# Patient Record
Sex: Female | Born: 1965 | Race: White | Hispanic: No | Marital: Single | Smoking: Current every day smoker
Health system: Southern US, Community
[De-identification: ages and names within clinical notes are randomized; demographics above are authoritative.]

---

## 2018-09-05 ENCOUNTER — Encounter: Payer: Self-pay | Admitting: Pediatric Intensive Care

## 2018-09-20 ENCOUNTER — Encounter: Payer: Self-pay | Admitting: Hematology

## 2018-09-20 NOTE — Congregational Nurse Program (Unsigned)
IRC Covid 19 Screening performed at [facility].  Patient AxO, afebrile, and denies any signs or symptoms of Covid at this time.  PHQ-9 performed and assessed need for assistance with obtaining medications.  Patient needs assistance obtaining her medications.  A referral will be made to [facility].

## 2018-09-20 NOTE — Progress Notes (Signed)
error 

## 2018-09-27 ENCOUNTER — Telehealth: Payer: Self-pay

## 2018-09-27 NOTE — Progress Notes (Signed)
COVID Hotel Screening performed. Temperature, PHQ-9, and need for medical care and medications assessed. Patient asked for assistance for ADHD medication. Patient has never been diagnosed, tested, or treated by a physician. No referral made at this time. Recommended that patient speak to her provider.  Carlyle Basques RN MSN

## 2018-09-30 NOTE — Telephone Encounter (Signed)
Spoke with Linda Rosales as a F/U from [facility] evaluation. Stated that she has an appointment with [facility] in [facility] for renewal of her medication of Ritalin. Gave her my phone # and will advise to keep me abreast of events.

## 2018-10-04 NOTE — Progress Notes (Signed)
COVID Hotel Screening performed. Temperature, PHQ-9, and need for medical care and medications assessed. No additional needs assessed at this time.  Kenai Fluegel RN MSN 

## 2018-10-11 ENCOUNTER — Telehealth: Payer: Self-pay

## 2018-10-11 NOTE — Progress Notes (Signed)
COVID Hotel Screening performed. Temperature, PHQ-9, and need for medical care and medications assessed. No additional needs assessed at this time.  Ann Bohne RN MSN 

## 2018-10-11 NOTE — Telephone Encounter (Signed)
Spoke with client who states that her medication refill was sent and picked up at [facility]. States that she has no complaints and has everything she needs.Gave her my phone # if circumstances should change.

## 2018-10-16 NOTE — Congregational Nurse Program (Signed)
  Dept: [phone]   Congregational Nurse Program Note  Date of Encounter: 09/05/2018  Past Medical History: No past medical history on file.  Encounter Details:Client has red itchy rash on left shoulder. CN advises clean with soap and water. Apply OTC cortisone if itchy. Shann Medal RN BSN CNP

## 2018-10-17 ENCOUNTER — Emergency Department (HOSPITAL_COMMUNITY): Payer: Medicaid Other

## 2018-10-17 ENCOUNTER — Encounter (HOSPITAL_COMMUNITY): Payer: Self-pay | Admitting: Emergency Medicine

## 2018-10-17 ENCOUNTER — Other Ambulatory Visit: Payer: Self-pay

## 2018-10-17 ENCOUNTER — Emergency Department (HOSPITAL_COMMUNITY)
Admission: EM | Admit: 2018-10-17 | Discharge: 2018-10-17 | Disposition: A | Discharge status: Home / Self Care | Payer: Medicaid Other | Attending: Emergency Medicine | Admitting: Emergency Medicine

## 2018-10-17 DIAGNOSIS — Y929 Unspecified place or not applicable: Secondary | ICD-10-CM | POA: Insufficient documentation

## 2018-10-17 DIAGNOSIS — Y999 Unspecified external cause status: Secondary | ICD-10-CM | POA: Insufficient documentation

## 2018-10-17 DIAGNOSIS — S80212A Abrasion, left knee, initial encounter: Secondary | ICD-10-CM | POA: Diagnosis not present

## 2018-10-17 DIAGNOSIS — S50312A Abrasion of left elbow, initial encounter: Secondary | ICD-10-CM | POA: Diagnosis not present

## 2018-10-17 DIAGNOSIS — R0789 Other chest pain: Secondary | ICD-10-CM | POA: Insufficient documentation

## 2018-10-17 DIAGNOSIS — Y939 Activity, unspecified: Secondary | ICD-10-CM | POA: Insufficient documentation

## 2018-10-17 DIAGNOSIS — F17210 Nicotine dependence, cigarettes, uncomplicated: Secondary | ICD-10-CM | POA: Insufficient documentation

## 2018-10-17 MED ORDER — IBUPROFEN 400 MG PO TABS
600.0000 mg | ORAL_TABLET | Freq: Once | ORAL | Status: AC
  Administered 2018-10-17: 600 mg via ORAL
  Filled 2018-10-17: qty 1

## 2018-10-17 MED ORDER — DICLOFENAC SODIUM 1 % TD GEL: 4.0000 g | Freq: Four times a day (QID) | TRANSDERMAL | 0 refills | Status: AC

## 2018-10-17 MED ORDER — OXYCODONE-ACETAMINOPHEN 5-325 MG PO TABS: 1.0000 | ORAL_TABLET | Freq: Four times a day (QID) | ORAL | 0 refills | Status: AC | PRN

## 2018-10-17 MED ORDER — LIDOCAINE 5 % EX PTCH: 1.0000 | MEDICATED_PATCH | CUTANEOUS | 0 refills | Status: DC
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
## 2018-10-17 NOTE — Discharge Instructions (Addendum)
Rib injuries  You have sustained a rib injury.  There were no acute abnormalities noted on the x-ray, including no sign of fractures, although there can be fractures that do not show up on the x-ray, called occult fractures.  Please adhere to the following instructions: Incentive spirometer: This device is used to ensure proper expansion of the lungs and to help prevent secondary issues, such as pneumonia.  Think of this as physical therapy for your lungs while you are injured.  Perform lung expansion exercises every 1-2 hours while awake.  Have an initial goal of 1000 mL and then work to increase this value.  Antiinflammatory medications: Take 600 mg of ibuprofen every 6 hours or 440 mg (over the counter dose) to 500 mg (prescription dose) of naproxen every 12 hours for the next 3 days. After this time, these medications may be used as needed for pain. Take these medications with food to avoid upset stomach. Choose only one of these medications, do not take them together. Tylenol: Should you continue to have additional pain while taking the ibuprofen or naproxen, you may add in tylenol as needed. Your daily total maximum amount of tylenol from all sources should be limited to 4000mg /day for persons without liver problems, or 2000mg /day for those with liver problems. Percocet: May take Percocet (oxycodone-acetaminophen) as needed for severe pain.   Do not drive or perform other dangerous activities while taking this medication as it can cause drowsiness as well as changes in reaction time and judgement.  Please note that each pill of Percocet contains 325 mg of acetaminophen (generic for Tylenol) and the above dosage limits apply. Diclofenac gel: This is a topical anti-inflammatory medication and can be applied directly to the painful region.  Do not use on the face or genitals.  This medication may be used as an alternative to oral anti-inflammatory medications, such as ibuprofen or naproxen.  Other  pain management: Many parts of pain management involve experimentation to find what works for you as an individual patient.  You may try lidocaine patches, topical pain relievers, or hot/cold packs.  You may also need to change your sleeping position.  This may involve sleeping propped up in a chair or with extra pillows.  Duration of pain: For bruising or contusions to the ribs, pain can last 4-6 weeks.  For rib fractures, you can expect to have discomfort for 6-12 weeks.  It should be noted that even if there are no obvious fractures on the x-rays, you may have what is called an occult fracture.  This simply means that the bone is broken, but is not broken enough to be noted on x-ray.  Follow-up: Please follow-up with a primary care provider for any further management of this issue.  Any further pain management should also be handled by a primary care provider.  Return: Return to the ED should you begin to have significantly worsening pain, onset of shortness of breath (not just hesitancy to take a deep breath due to pain), fever over 100.3 F accompanied by cough, coughing up blood, or any other major concerns.  For prescription assistance, may try using prescription discount sites or apps, such as goodrx.com   Wound Care - General Wound Cleaning: Clean the wound and surrounding area gently with tap water and mild soap. Rinse well and blot dry. Do not scrub the wound, as this may cause the wound edges to come apart. You may shower, but avoid submerging the wound, such as with a bath  or swimming.  Clean the wound daily to prevent infection.  Do not use cleaners such as hydrogen peroxide or alcohol.   Scar reduction: Application of a topical antibiotic ointment, such as Neosporin, after the wound has begun to close and heal well can decrease scab formation and reduce scarring. After the wound has healed, application of ointments such as Aquaphor can also reduce scar formation.  The key to scar  reduction is keeping the skin well hydrated and supple. Drinking plenty of water throughout the day (At least eight 8oz glasses of water a day) is essential to staying well hydrated.  Sun exposure: Keep the wound out of the sun. After the wound has healed, continue to protect it from the sun by wearing protective clothing or applying sunscreen.  Pain: You may use Tylenol, naproxen, or ibuprofen for pain. Antiinflammatory medications: Take 600 mg of ibuprofen every 6 hours or 440 mg (over the counter dose) to 500 mg (prescription dose) of naproxen every 12 hours for the next 3 days. After this time, these medications may be used as needed for pain. Take these medications with food to avoid upset stomach. Choose only one of these medications, do not take them together. Acetaminophen (generic for Tylenol): Should you continue to have additional pain while taking the ibuprofen or naproxen, you may add in acetaminophen as needed. Your daily total maximum amount of acetaminophen from all sources should be limited to 4000mg /day for persons without liver problems, or 2000mg /day for those with liver problems.  Return: Return to the ED should signs of infection arise, such as spreading redness, puffiness/swelling, pus draining from the wound, severe increase in pain, fever over 100.30F, or any other major issues.  For prescription assistance, may try using prescription discount sites or apps, such as goodrx.com
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
## 2018-10-18 NOTE — Progress Notes (Signed)
COVID Hotel Screening performed. Temperature, PHQ-9, and need for medical care and medications assessed. No additional needs assessed at this time.  Azahel Belcastro RN MSN 

## 2019-01-31 ENCOUNTER — Encounter (HOSPITAL_COMMUNITY): Payer: Self-pay | Admitting: Emergency Medicine

## 2019-01-31 ENCOUNTER — Emergency Department (HOSPITAL_COMMUNITY)
Admission: EM | Admit: 2019-01-31 | Discharge: 2019-01-31 | Disposition: A | Discharge status: Home / Self Care | Payer: Medicaid Other | Attending: Emergency Medicine | Admitting: Emergency Medicine

## 2019-01-31 ENCOUNTER — Emergency Department (HOSPITAL_COMMUNITY): Payer: Medicaid Other

## 2019-01-31 ENCOUNTER — Other Ambulatory Visit: Payer: Self-pay

## 2019-01-31 DIAGNOSIS — F17200 Nicotine dependence, unspecified, uncomplicated: Secondary | ICD-10-CM | POA: Insufficient documentation

## 2019-01-31 DIAGNOSIS — F1910 Other psychoactive substance abuse, uncomplicated: Secondary | ICD-10-CM | POA: Insufficient documentation

## 2019-01-31 DIAGNOSIS — R4182 Altered mental status, unspecified: Secondary | ICD-10-CM | POA: Diagnosis present

## 2019-01-31 LAB — CBC WITH DIFFERENTIAL/PLATELET
Abs Immature Granulocytes: 0.01 10*3/uL (ref 0.00–0.07)
Basophils Absolute: 0 10*3/uL (ref 0.0–0.1)
Basophils Relative: 0 %
Eosinophils Absolute: 0.2 10*3/uL (ref 0.0–0.5)
Eosinophils Relative: 4 %
HCT: 37.1 % (ref 36.0–46.0)
Hemoglobin: 12.1 g/dL (ref 12.0–15.0)
Immature Granulocytes: 0 %
Lymphocytes Relative: 23 %
Lymphs Abs: 1.3 10*3/uL (ref 0.7–4.0)
MCH: 30.4 pg (ref 26.0–34.0)
MCHC: 32.6 g/dL (ref 30.0–36.0)
MCV: 93.2 fL (ref 80.0–100.0)
Monocytes Absolute: 0.4 10*3/uL (ref 0.1–1.0)
Monocytes Relative: 7 %
Neutro Abs: 3.7 10*3/uL (ref 1.7–7.7)
Neutrophils Relative %: 66 %
Platelets: 204 10*3/uL (ref 150–400)
RBC: 3.98 MIL/uL (ref 3.87–5.11)
RDW: 12.3 % (ref 11.5–15.5)
WBC: 5.6 10*3/uL (ref 4.0–10.5)
nRBC: 0 % (ref 0.0–0.2)

## 2019-01-31 LAB — COMPREHENSIVE METABOLIC PANEL
ALT: 14 U/L (ref 0–44)
AST: 19 U/L (ref 15–41)
Albumin: 3.1 g/dL — ABNORMAL LOW (ref 3.5–5.0)
Alkaline Phosphatase: 75 U/L (ref 38–126)
Anion gap: 10 (ref 5–15)
BUN: 11 mg/dL (ref 6–20)
CO2: 26 mmol/L (ref 22–32)
Calcium: 8.9 mg/dL (ref 8.9–10.3)
Chloride: 109 mmol/L (ref 98–111)
Creatinine, Ser: 0.91 mg/dL (ref 0.44–1.00)
GFR calc Af Amer: >60 mL/min (ref >60)
GFR calc non Af Amer: >60 mL/min (ref >60)
Glucose, Bld: 114 mg/dL — ABNORMAL HIGH (ref 70–99)
Potassium: 3.6 mmol/L (ref 3.5–5.1)
Sodium: 145 mmol/L (ref 135–145)
Total Bilirubin: 0.3 mg/dL (ref 0.3–1.2)
Total Protein: 6.2 g/dL — ABNORMAL LOW (ref 6.5–8.1)

## 2019-01-31 LAB — URINALYSIS, ROUTINE W REFLEX MICROSCOPIC
Bacteria, UA: NONE SEEN
Bilirubin Urine: NEGATIVE
Glucose, UA: NEGATIVE mg/dL
Hgb urine dipstick: NEGATIVE
Ketones, ur: 5 mg/dL — AB
Nitrite: NEGATIVE
Protein, ur: NEGATIVE mg/dL
Specific Gravity, Urine: 1.023 (ref 1.005–1.030)
pH: 5 (ref 5.0–8.0)

## 2019-01-31 LAB — ETHANOL: Alcohol, Ethyl (B): <10 mg/dL (ref <10)

## 2019-01-31 LAB — RAPID URINE DRUG SCREEN, HOSP PERFORMED
Amphetamines: POSITIVE — AB
Barbiturates: NOT DETECTED
Benzodiazepines: POSITIVE — AB
Cocaine: POSITIVE — AB
Opiates: POSITIVE — AB
Tetrahydrocannabinol: NOT DETECTED

## 2019-01-31 LAB — HCG, QUANTITATIVE, PREGNANCY: hCG, Beta Chain, Quant, S: <1 m[IU]/mL (ref <5)

## 2019-01-31 NOTE — Discharge Instructions (Addendum)
DON'T USE DRUGS!!!

## 2019-01-31 NOTE — ED Notes (Signed)
Pt called for triage, no response from lobby 
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
## 2019-01-31 NOTE — ED Notes (Signed)
X-ray at bedside
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
## 2019-02-01 ENCOUNTER — Emergency Department (HOSPITAL_COMMUNITY)
Admission: EM | Admit: 2019-02-01 | Discharge: 2019-02-01 | Disposition: A | Discharge status: Home / Self Care | Payer: Medicaid Other | Attending: Emergency Medicine | Admitting: Emergency Medicine

## 2019-02-01 ENCOUNTER — Encounter (HOSPITAL_COMMUNITY): Payer: Self-pay | Admitting: Emergency Medicine

## 2019-02-01 ENCOUNTER — Emergency Department (HOSPITAL_COMMUNITY)
Admission: EM | Admit: 2019-02-01 | Discharge: 2019-02-01 | Discharge status: Against Medical Advice | Payer: Medicaid Other | Source: Home / Self Care

## 2019-02-01 ENCOUNTER — Other Ambulatory Visit: Payer: Self-pay

## 2019-02-01 DIAGNOSIS — Z041 Encounter for examination and observation following transport accident: Secondary | ICD-10-CM | POA: Diagnosis not present

## 2019-02-01 DIAGNOSIS — Z5321 Procedure and treatment not carried out due to patient leaving prior to being seen by health care provider: Secondary | ICD-10-CM | POA: Diagnosis not present
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
## 2019-02-01 NOTE — ED Notes (Signed)
Call patient several times patient didn't answer  

## 2019-02-02 ENCOUNTER — Encounter (HOSPITAL_COMMUNITY): Payer: Self-pay | Admitting: Emergency Medicine

## 2019-02-02 ENCOUNTER — Other Ambulatory Visit: Payer: Self-pay

## 2019-02-02 ENCOUNTER — Emergency Department (HOSPITAL_COMMUNITY)
Admission: EM | Admit: 2019-02-02 | Discharge: 2019-02-03 | Disposition: A | Discharge status: Home / Self Care | Payer: Medicaid Other | Attending: Emergency Medicine | Admitting: Emergency Medicine

## 2019-02-02 DIAGNOSIS — F17210 Nicotine dependence, cigarettes, uncomplicated: Secondary | ICD-10-CM | POA: Diagnosis not present

## 2019-02-02 DIAGNOSIS — R52 Pain, unspecified: Secondary | ICD-10-CM | POA: Diagnosis not present

## 2019-02-02 DIAGNOSIS — Z79899 Other long term (current) drug therapy: Secondary | ICD-10-CM | POA: Insufficient documentation
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
## 2019-02-03 MED ORDER — NAPROXEN 500 MG PO TABS: 500.0000 mg | ORAL_TABLET | Freq: Two times a day (BID) | ORAL | 0 refills | Status: DC

## 2019-02-03 MED ORDER — METHOCARBAMOL 500 MG PO TABS: 500.0000 mg | ORAL_TABLET | Freq: Two times a day (BID) | ORAL | 0 refills | Status: DC
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
## 2019-02-03 NOTE — ED Notes (Signed)
Attempting to discharge pt when she become boisterous and agitated. Stating that she needs to see a doctor. Attempted to explain to pt that she has been seen by our PA and that the medications she has been prescribed are the only ones that the PA will be giving her today. Pt behavior escalates to cursing and tossing her BP and pulse ox probe. Security called and pt being escorted from department. Pt ambulating of her own volition.
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
## 2019-02-05 ENCOUNTER — Other Ambulatory Visit: Payer: Self-pay

## 2019-02-05 ENCOUNTER — Emergency Department (HOSPITAL_COMMUNITY)
Admission: EM | Admit: 2019-02-05 | Discharge: 2019-02-05 | Disposition: A | Discharge status: Home / Self Care | Payer: Medicaid Other | Attending: Emergency Medicine | Admitting: Emergency Medicine

## 2019-02-05 DIAGNOSIS — R52 Pain, unspecified: Secondary | ICD-10-CM

## 2019-02-05 DIAGNOSIS — Z79899 Other long term (current) drug therapy: Secondary | ICD-10-CM | POA: Insufficient documentation

## 2019-02-05 DIAGNOSIS — M7918 Myalgia, other site: Secondary | ICD-10-CM | POA: Insufficient documentation

## 2019-02-05 DIAGNOSIS — F17210 Nicotine dependence, cigarettes, uncomplicated: Secondary | ICD-10-CM | POA: Insufficient documentation

## 2019-02-05 MED ORDER — NAPROXEN 500 MG PO TABS: 500.0000 mg | ORAL_TABLET | Freq: Two times a day (BID) | ORAL | 0 refills | Status: AC

## 2019-02-05 MED ORDER — KETOROLAC TROMETHAMINE 30 MG/ML IJ SOLN
15.0000 mg | Freq: Once | INTRAMUSCULAR | Status: AC
  Administered 2019-02-05: 15 mg via INTRAMUSCULAR
  Filled 2019-02-05: qty 1

## 2019-02-05 MED ORDER — METHOCARBAMOL 500 MG PO TABS: 500.0000 mg | ORAL_TABLET | Freq: Two times a day (BID) | ORAL | 0 refills | Status: AC

## 2019-02-05 MED ORDER — LIDOCAINE 5 % EX PTCH: 1.0000 | MEDICATED_PATCH | CUTANEOUS | 0 refills | Status: AC
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
## 2019-02-05 NOTE — Discharge Instructions (Signed)
Your medications were sent to the [facility] on [facility].  You can take naproxen twice daily with meals.  You can take Robaxin up to twice daily as needed for muscle spasm.  Apply lidocaine patch to areas of pain as prescribed.  Doing plenty of fluids get plenty of rest.  You can also take 1 to 2 tablets of Tylenol every 6 hours as needed for pain.  You can do some gentle stretching, apply ice or heat to areas of pain, whichever feels best, 20 minutes at a time 3-4 times daily.  Follow-up with the methadone clinic for reevaluation of your symptoms and management of your pain.  We do not manage chronic pain in the emergency department and we cannot prescribe methadone.  Return to the emergency department if any concerning signs or symptoms develop such as persistent vomiting, high fevers, or weakness of an extremity.
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
## 2019-02-06 ENCOUNTER — Telehealth: Payer: Self-pay | Admitting: *Deleted

## 2019-02-06 NOTE — Telephone Encounter (Signed)
TOC CM received call from pharmacy and wanted to verify RX. States they received and reviewed with pharmacy. [place], [place] ED TOC CM [phone]

## 2019-02-10 ENCOUNTER — Emergency Department (HOSPITAL_COMMUNITY)
Admission: EM | Admit: 2019-02-10 | Discharge: 2019-02-11 | Disposition: A | Discharge status: Home / Self Care | Payer: Medicaid Other | Attending: Emergency Medicine | Admitting: Emergency Medicine

## 2019-02-10 ENCOUNTER — Emergency Department (HOSPITAL_COMMUNITY): Payer: Medicaid Other

## 2019-02-10 ENCOUNTER — Encounter (HOSPITAL_COMMUNITY): Payer: Self-pay | Admitting: Emergency Medicine

## 2019-02-10 ENCOUNTER — Other Ambulatory Visit: Payer: Self-pay

## 2019-02-10 DIAGNOSIS — Z7189 Other specified counseling: Secondary | ICD-10-CM | POA: Diagnosis not present

## 2019-02-10 DIAGNOSIS — R4182 Altered mental status, unspecified: Secondary | ICD-10-CM | POA: Insufficient documentation

## 2019-02-10 DIAGNOSIS — Z590 Homelessness: Secondary | ICD-10-CM | POA: Insufficient documentation

## 2019-02-10 DIAGNOSIS — F17210 Nicotine dependence, cigarettes, uncomplicated: Secondary | ICD-10-CM | POA: Insufficient documentation

## 2019-02-10 DIAGNOSIS — S32591A Other specified fracture of right pubis, initial encounter for closed fracture: Secondary | ICD-10-CM

## 2019-02-10 DIAGNOSIS — M25552 Pain in left hip: Secondary | ICD-10-CM | POA: Insufficient documentation

## 2019-02-10 LAB — COMPREHENSIVE METABOLIC PANEL
ALT: 26 U/L (ref 0–44)
AST: 41 U/L (ref 15–41)
Albumin: 3.7 g/dL (ref 3.5–5.0)
Alkaline Phosphatase: 123 U/L (ref 38–126)
Anion gap: 10 (ref 5–15)
BUN: 22 mg/dL — ABNORMAL HIGH (ref 6–20)
CO2: 25 mmol/L (ref 22–32)
Calcium: 9.1 mg/dL (ref 8.9–10.3)
Chloride: 106 mmol/L (ref 98–111)
Creatinine, Ser: 0.94 mg/dL (ref 0.44–1.00)
GFR calc Af Amer: >60 mL/min (ref >60)
GFR calc non Af Amer: >60 mL/min (ref >60)
Glucose, Bld: 106 mg/dL — ABNORMAL HIGH (ref 70–99)
Potassium: 4.4 mmol/L (ref 3.5–5.1)
Sodium: 141 mmol/L (ref 135–145)
Total Bilirubin: 0.6 mg/dL (ref 0.3–1.2)
Total Protein: 7 g/dL (ref 6.5–8.1)

## 2019-02-10 LAB — CBC WITH DIFFERENTIAL/PLATELET
Abs Immature Granulocytes: 0.01 10*3/uL (ref 0.00–0.07)
Basophils Absolute: 0 10*3/uL (ref 0.0–0.1)
Basophils Relative: 0 %
Eosinophils Absolute: 0.3 10*3/uL (ref 0.0–0.5)
Eosinophils Relative: 4 %
HCT: 35.9 % — ABNORMAL LOW (ref 36.0–46.0)
Hemoglobin: 11.6 g/dL — ABNORMAL LOW (ref 12.0–15.0)
Immature Granulocytes: 0 %
Lymphocytes Relative: 14 %
Lymphs Abs: 1 10*3/uL (ref 0.7–4.0)
MCH: 29.7 pg (ref 26.0–34.0)
MCHC: 32.3 g/dL (ref 30.0–36.0)
MCV: 92.1 fL (ref 80.0–100.0)
Monocytes Absolute: 0.5 10*3/uL (ref 0.1–1.0)
Monocytes Relative: 8 %
Neutro Abs: 4.9 10*3/uL (ref 1.7–7.7)
Neutrophils Relative %: 74 %
Platelets: 309 10*3/uL (ref 150–400)
RBC: 3.9 MIL/uL (ref 3.87–5.11)
RDW: 12.3 % (ref 11.5–15.5)
WBC: 6.8 10*3/uL (ref 4.0–10.5)
nRBC: 0 % (ref 0.0–0.2)

## 2019-02-10 LAB — ETHANOL: Alcohol, Ethyl (B): <10 mg/dL (ref <10)

## 2019-02-10 MED ORDER — LACTATED RINGERS IV BOLUS
1000.0000 mL | Freq: Once | INTRAVENOUS | Status: AC
  Administered 2019-02-10: 1000 mL via INTRAVENOUS
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
## 2019-02-10 NOTE — ED Notes (Signed)
Patient found lying on floor instead of stretcher. Patient states that she did not fall, but chose to lay down for comfort. Patient states that pain "is not too bad right now." Patient assisted to stretcher, provided warm blanket, and instructed to remain in bed.
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
## 2019-02-10 NOTE — ED Notes (Signed)
Patient transported to radiology

## 2019-02-10 NOTE — ED Notes (Signed)
Patient made aware that urine sample is needed.  

## 2019-02-11 LAB — RAPID URINE DRUG SCREEN, HOSP PERFORMED
Amphetamines: POSITIVE — AB
Barbiturates: NOT DETECTED
Benzodiazepines: POSITIVE — AB
Cocaine: NOT DETECTED
Opiates: NOT DETECTED
Tetrahydrocannabinol: NOT DETECTED

## 2019-02-11 MED ORDER — KETOROLAC TROMETHAMINE 30 MG/ML IJ SOLN
30.0000 mg | Freq: Once | INTRAMUSCULAR | Status: AC
  Administered 2019-02-11: 09:00:00 30 mg via INTRAMUSCULAR
  Filled 2019-02-11: qty 1

## 2019-02-11 MED ORDER — IBUPROFEN 800 MG PO TABS: 800.0000 mg | ORAL_TABLET | Freq: Four times a day (QID) | ORAL | 0 refills | Status: AC | PRN
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
## 2019-02-11 NOTE — ED Notes (Signed)
Patient continuously calling out and stating that she is ready to go home. EDP made aware.

## 2019-02-11 NOTE — Progress Notes (Signed)
TOC CM contacted [facility], spoke to rep, [place] for RW with seat to be delivered to pt's room prior to dc. [place], [place] ED TOC CM [phone]
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
## 2019-02-12 ENCOUNTER — Ambulatory Visit (HOSPITAL_COMMUNITY)
Admission: RE | Admit: 2019-02-12 | Discharge: 2019-02-12 | Disposition: A | Discharge status: Home / Self Care | Payer: Medicaid Other | Attending: Psychiatry | Admitting: Psychiatry

## 2019-02-12 DIAGNOSIS — R45851 Suicidal ideations: Secondary | ICD-10-CM | POA: Diagnosis not present

## 2019-02-12 DIAGNOSIS — F17210 Nicotine dependence, cigarettes, uncomplicated: Secondary | ICD-10-CM | POA: Insufficient documentation

## 2019-02-12 DIAGNOSIS — F1120 Opioid dependence, uncomplicated: Secondary | ICD-10-CM | POA: Diagnosis not present
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
## 2019-03-09 ENCOUNTER — Encounter (HOSPITAL_COMMUNITY): Payer: Self-pay

## 2019-03-09 ENCOUNTER — Ambulatory Visit (HOSPITAL_COMMUNITY)
Admission: EM | Admit: 2019-03-09 | Discharge: 2019-03-09 | Disposition: A | Discharge status: Home / Self Care | Payer: Medicaid Other | Attending: Physician Assistant | Admitting: Physician Assistant

## 2019-03-09 ENCOUNTER — Other Ambulatory Visit: Payer: Self-pay

## 2019-03-09 DIAGNOSIS — L02419 Cutaneous abscess of limb, unspecified: Secondary | ICD-10-CM

## 2019-03-09 DIAGNOSIS — L03115 Cellulitis of right lower limb: Secondary | ICD-10-CM

## 2019-03-09 MED ORDER — LIDOCAINE HCL (PF) 2 % IJ SOLN
INTRAMUSCULAR | Status: AC
  Filled 2019-03-09: qty 2

## 2019-03-09 MED ORDER — CEFTRIAXONE SODIUM 1 G IJ SOLR
1.0000 g | Freq: Once | INTRAMUSCULAR | Status: AC
  Administered 2019-03-09: 12:00:00 1 g via INTRAMUSCULAR

## 2019-03-09 MED ORDER — SULFAMETHOXAZOLE-TRIMETHOPRIM 800-160 MG PO TABS: 1.0000 | ORAL_TABLET | Freq: Two times a day (BID) | ORAL | 0 refills | Status: DC

## 2019-03-09 MED ORDER — LIDOCAINE-EPINEPHRINE (PF) 2 %-1:200000 IJ SOLN
INTRAMUSCULAR | Status: AC
  Filled 2019-03-09: qty 20

## 2019-03-09 MED ORDER — CEFTRIAXONE SODIUM 1 G IJ SOLR
INTRAMUSCULAR | Status: AC
  Filled 2019-03-09: qty 10

## 2019-03-09 NOTE — Discharge Instructions (Addendum)
Return in 2 days for wound check and dressing change. Keep area clean and dry. Take medication as prescribed.

## 2019-03-09 NOTE — ED Triage Notes (Signed)
Pt presents with abscess to back of right leg X 1 week.

## 2019-03-09 NOTE — ED Provider Notes (Signed)
[facility]    CSN: [id number] Arrival date & time: 03/09/19  1051      History   Chief Complaint Chief Complaint  Patient presents with  . Abscess    Right Leg    HPI Linda Rosales is a 53 y.o. female.   Patient here concerned with pain, swelling R lower leg.  Started 1 week ago, worsening.  Notes today leg is swollen, tender, she's had an abscess before and feels that she has another one.  Denies fatigue, fever, chills, nausea, vomiting.  No advil or tylenol today.     History reviewed. No pertinent past medical history.  There are no active problems to display for this patient.   History reviewed. No pertinent surgical history.  OB History   No obstetric history on file.      Home Medications    Prior to Admission medications   Medication Sig Start Date End Date Taking? Authorizing Provider  diclofenac sodium (VOLTAREN) 1 % GEL Apply 4 g topically 4 (four) times daily. Patient not taking: Reported on 02/10/2019 10/17/18   [name], [name] C, PA-C  gabapentin (NEURONTIN) 400 MG capsule Take 400 mg by mouth 3 (three) times daily.    [provider]  ibuprofen (ADVIL) 800 MG tablet Take 1 tablet (800 mg total) by mouth every 6 (six) hours as needed for moderate pain. 02/11/19   [name], MD  lidocaine (LIDODERM) 5 % Place 1 patch onto the skin daily. Remove & Discard patch within 12 hours or as directed by MD Patient not taking: Reported on 02/10/2019 02/05/19   Rodell Perna A, PA-C  methocarbamol (ROBAXIN) 500 MG tablet Take 1 tablet (500 mg total) by mouth 2 (two) times daily. Patient not taking: Reported on 02/10/2019 02/05/19   Rodell Perna A, PA-C  naproxen (NAPROSYN) 500 MG tablet Take 1 tablet (500 mg total) by mouth 2 (two) times daily with a meal. Patient not taking: Reported on 02/10/2019 02/05/19   Rodell Perna A, PA-C  oxyCODONE-acetaminophen (PERCOCET/ROXICET) 5-325 MG tablet Take 1 tablet by mouth every 6 (six) hours as needed for  severe pain. Patient not taking: Reported on 02/10/2019 10/17/18   [name], [name] C, PA-C  sulfamethoxazole-trimethoprim (BACTRIM DS) 800-160 MG tablet Take 1 tablet by mouth 2 (two) times daily. 03/09/19   [name], PA-C    Family History Family History  Family history unknown: Yes    Social History Social History   Tobacco Use  . Smoking status: Current Every Day Smoker    Packs/day: 0.25  . Smokeless tobacco: Never Used  Substance Use Topics  . Alcohol use: Not Currently  . Drug use: Never     Allergies   Patient has no known allergies.   Review of Systems Review of Systems  Constitutional: Negative for activity change, appetite change, chills, fatigue and fever.  Gastrointestinal: Negative for abdominal pain, diarrhea, nausea and vomiting.  Musculoskeletal: Positive for myalgias. Negative for arthralgias.  Skin: Positive for rash.  Allergic/Immunologic: Negative for immunocompromised state.  Neurological: Negative for weakness, light-headedness and numbness.  Hematological: Negative for adenopathy. Does not bruise/bleed easily.  Psychiatric/Behavioral: Negative for confusion and sleep disturbance.     Physical Exam Triage Vital Signs ED Triage Vitals  Enc Vitals Group     BP 03/09/19 1107 121/76     Pulse Rate 03/09/19 1107 83     Resp 03/09/19 1107 17     Temp 03/09/19 1107 98.6 F (37 C)  Temp Source 03/09/19 1107 Oral     SpO2 03/09/19 1107 95 %     Weight --      Height --      Head Circumference --      Peak Flow --      Pain Score 03/09/19 1108 9     Pain Loc --      Pain Edu? --      Excl. in GC? --    No data found.  Updated Vital Signs BP 121/76 (BP Location: Left Arm)   Pulse 83   Temp 98.6 F (37 C) (Oral)   Resp 17   SpO2 95%   Visual Acuity Right Eye Distance:   Left Eye Distance:   Bilateral Distance:    Right Eye Near:   Left Eye Near:    Bilateral Near:     Physical Exam Vitals signs and nursing note reviewed.   Constitutional:      General: She is not in acute distress.    Appearance: She is well-developed. She is not ill-appearing or toxic-appearing.  HENT:     Head: Normocephalic and atraumatic.     Nose: Nose normal.     Mouth/Throat:     Mouth: Mucous membranes are moist.  Eyes:     Extraocular Movements: Extraocular movements intact.     Conjunctiva/sclera: Conjunctivae normal.     Pupils: Pupils are equal, round, and reactive to light.  Neck:     Musculoskeletal: Normal range of motion and neck supple.  Cardiovascular:     Rate and Rhythm: Normal rate and regular rhythm.     Heart sounds: No murmur.  Pulmonary:     Effort: Pulmonary effort is normal. No respiratory distress.     Breath sounds: Normal breath sounds.  Abdominal:     Palpations: Abdomen is soft.     Tenderness: There is no abdominal tenderness.  Musculoskeletal:     Right knee: She exhibits normal range of motion.     Right lower leg: She exhibits tenderness and swelling. She exhibits no laceration. No edema.       Legs:  Skin:    General: Skin is warm and dry.     Capillary Refill: Capillary refill takes less than 2 seconds.  Neurological:     General: No focal deficit present.     Mental Status: She is alert and oriented to person, place, and time.  Psychiatric:        Mood and Affect: Mood normal.        Behavior: Behavior normal.      UC Treatments / Results  Labs (all labs ordered are listed, but only abnormal results are displayed) Labs Reviewed - No data to display  EKG   Radiology No results found.  Procedures Incision and Drainage  Date/Time: 03/09/2019 11:38 AM Performed by: Evern CoreLindquist, Jady Braggs, PA-C Authorized by: Evern CoreLindquist, Jesscia Imm, PA-C   Consent:    Consent obtained:  Verbal   Consent given by:  Patient   Risks discussed:  Bleeding, incomplete drainage, pain and damage to other organs   Alternatives discussed:  No treatment and delayed treatment Universal protocol:    Procedure  explained and questions answered to patient or proxy's satisfaction: yes     Relevant documents present and verified: yes     Test results available and properly labeled: yes     Imaging studies available: yes     Required blood products, implants, devices, and special equipment available: yes  Site/side marked: yes     Immediately prior to procedure a time out was called: yes     Patient identity confirmed:  Verbally with patient Location:    Type:  Abscess   Size:  4 cm   Location:  Lower extremity   Lower extremity location:  Leg   Leg location:  R lower leg Pre-procedure details:    Skin preparation:  Betadine Anesthesia (see MAR for exact dosages):    Anesthesia method:  Local infiltration   Local anesthetic:  Lidocaine 2% WITH epi Procedure type:    Complexity:  Complex Procedure details:    Needle aspiration: no     Incision types:  Single straight   Incision depth:  Subcutaneous   Scalpel blade:  11   Wound management:  Probed and deloculated, irrigated with saline and extensive cleaning   Drainage:  Purulent   Drainage amount:  Copious   Wound treatment:  Wound left open   Packing materials:  1/2 in iodoform gauze Post-procedure details:    Patient tolerance of procedure:  Tolerated well, no immediate complications   (including critical care time)  Medications Ordered in UC Medications  cefTRIAXone (ROCEPHIN) injection 1 g (1 g Intramuscular Given 03/09/19 1156)  lidocaine-EPINEPHrine (XYLOCAINE W/EPI) 2 %-1:200000 (PF) injection (has no administration in time range)  cefTRIAXone (ROCEPHIN) 1 g injection (has no administration in time range)  lidocaine (XYLOCAINE) 2 % injection (has no administration in time range)    Initial Impression / Assessment and Plan / UC Course  I have reviewed the triage vital signs and the nursing notes.  Pertinent labs & imaging results that were available during my care of the patient were reviewed by me and considered in my  medical decision making (see chart for details).      Final Clinical Impressions(s) / UC Diagnoses   Final diagnoses:  Cellulitis and abscess of leg     Discharge Instructions     Return in 2 days for wound check and dressing change. Keep area clean and dry. Take medication as prescribed.     ED Prescriptions    Medication Sig Dispense Auth. Provider   sulfamethoxazole-trimethoprim (BACTRIM DS) 800-160 MG tablet Take 1 tablet by mouth 2 (two) times daily. 20 tablet Evern Core, PA-C     PDMP not reviewed this encounter.   Evern Core, PA-C 03/09/19 1225

## 2019-03-12 ENCOUNTER — Encounter (HOSPITAL_COMMUNITY): Payer: Self-pay

## 2019-03-12 ENCOUNTER — Ambulatory Visit (HOSPITAL_COMMUNITY)
Admission: EM | Admit: 2019-03-12 | Discharge: 2019-03-12 | Disposition: A | Discharge status: Home / Self Care | Payer: Medicaid Other | Attending: Family Medicine | Admitting: Family Medicine

## 2019-03-12 ENCOUNTER — Other Ambulatory Visit: Payer: Self-pay

## 2019-03-12 DIAGNOSIS — L03115 Cellulitis of right lower limb: Secondary | ICD-10-CM

## 2019-03-12 DIAGNOSIS — Z09 Encounter for follow-up examination after completed treatment for conditions other than malignant neoplasm: Secondary | ICD-10-CM

## 2019-03-12 MED ORDER — CEFTRIAXONE SODIUM 1 G IJ SOLR
1.0000 g | Freq: Once | INTRAMUSCULAR | Status: AC
  Administered 2019-03-12: 1 g via INTRAMUSCULAR

## 2019-03-12 MED ORDER — CEFTRIAXONE SODIUM 1 G IJ SOLR
INTRAMUSCULAR | Status: AC
  Filled 2019-03-12: qty 10

## 2019-03-12 NOTE — ED Triage Notes (Signed)
Pt presents for wound check to large abscess that is on the back of her right leg.

## 2019-03-12 NOTE — ED Provider Notes (Signed)
[place]    CSN: [id number] Arrival date & time: 03/12/19  1014      History   Chief Complaint Chief Complaint  Patient presents with  . Follow-up  . Wound Check    HPI Linda Rosales is a 53 y.o. female.   Linda Rosales presents for follow up s/p I&D of right calf on 10/9 with cellulitis. She was given rocephin as well as course of bactrim at that time. Still with drainage. She feels the pressure has decreased some but it is still quite tender. No fevers. Has been taking antibiotics as prescribed. States she has had similar years ago. Per chart review history of recent polysubstance abuse.     ROS per HPI, negative if not otherwise mentioned.      History reviewed. No pertinent past medical history.  There are no active problems to display for this patient.   History reviewed. No pertinent surgical history.  OB History   No obstetric history on file.      Home Medications    Prior to Admission medications   Medication Sig Start Date End Date Taking? Authorizing Provider  diclofenac sodium (VOLTAREN) 1 % GEL Apply 4 g topically 4 (four) times daily. Patient not taking: Reported on 02/10/2019 10/17/18   [name], [name] C, PA-C  gabapentin (NEURONTIN) 400 MG capsule Take 400 mg by mouth 3 (three) times daily.    [provider]  ibuprofen (ADVIL) 800 MG tablet Take 1 tablet (800 mg total) by mouth every 6 (six) hours as needed for moderate pain. 02/11/19   [name], MD  lidocaine (LIDODERM) 5 % Place 1 patch onto the skin daily. Remove & Discard patch within 12 hours or as directed by MD Patient not taking: Reported on 02/10/2019 02/05/19   Rodell Perna A, PA-C  methocarbamol (ROBAXIN) 500 MG tablet Take 1 tablet (500 mg total) by mouth 2 (two) times daily. Patient not taking: Reported on 02/10/2019 02/05/19   Rodell Perna A, PA-C  naproxen (NAPROSYN) 500 MG tablet Take 1 tablet (500 mg total) by mouth 2 (two) times daily with a meal.  Patient not taking: Reported on 02/10/2019 02/05/19   Rodell Perna A, PA-C  oxyCODONE-acetaminophen (PERCOCET/ROXICET) 5-325 MG tablet Take 1 tablet by mouth every 6 (six) hours as needed for severe pain. Patient not taking: Reported on 02/10/2019 10/17/18   [name], [name] C, PA-C  sulfamethoxazole-trimethoprim (BACTRIM DS) 800-160 MG tablet Take 1 tablet by mouth 2 (two) times daily. 03/09/19   [name], PA-C    Family History Family History  Family history unknown: Yes    Social History Social History   Tobacco Use  . Smoking status: Current Every Day Smoker    Packs/day: 0.25  . Smokeless tobacco: Never Used  Substance Use Topics  . Alcohol use: Not Currently  . Drug use: Never     Allergies   Patient has no known allergies.   Review of Systems Review of Systems   Physical Exam Triage Vital Signs ED Triage Vitals  Enc Vitals Group     BP 03/12/19 1107 108/73     Pulse Rate 03/12/19 1107 62     Resp 03/12/19 1107 17     Temp 03/12/19 1107 97.6 F (36.4 C)     Temp Source 03/12/19 1107 Oral     SpO2 03/12/19 1107 95 %     Weight --      Height --      Head Circumference --  Peak Flow --      Pain Score 03/12/19 1109 6     Pain Loc --      Pain Edu? --      Excl. in GC? --    No data found.  Updated Vital Signs BP 108/73 (BP Location: Right Arm)   Pulse 62   Temp 97.6 F (36.4 C) (Oral)   Resp 17   SpO2 95%   Visual Acuity Right Eye Distance:   Left Eye Distance:   Bilateral Distance:    Right Eye Near:   Left Eye Near:    Bilateral Near:     Physical Exam Constitutional:      General: She is not in acute distress.    Appearance: She is well-developed.  Cardiovascular:     Rate and Rhythm: Normal rate.  Pulmonary:     Effort: Pulmonary effort is normal.  Skin:    General: Skin is warm and dry.     Comments: Right posterior calf still with large red with induration, tender; still active purulent drainage with packing, and large amount  with packing removal; at least approximately 8-12 inches of packing removed with approximately 4-6 inches replaced as patient unable to tolerated; quite deep pocket   Neurological:     Mental Status: She is alert and oriented to person, place, and time.        UC Treatments / Results  Labs (all labs ordered are listed, but only abnormal results are displayed) Labs Reviewed - No data to display  EKG   Radiology No results found.  Procedures Procedures (including critical care time)  Medications Ordered in UC Medications  cefTRIAXone (ROCEPHIN) injection 1 g (1 g Intramuscular Given 03/12/19 1154)  cefTRIAXone (ROCEPHIN) 1 g injection (has no administration in time range)    Initial Impression / Assessment and Plan / UC Course  I have reviewed the triage vital signs and the nursing notes.  Pertinent labs & imaging results that were available during my care of the patient were reviewed by me and considered in my medical decision making (see chart for details).     Quite impressive in depth and affected area size of abscess and cellulitis to posterior calf. Repacked due to significance of size of pocket and drainage quantity, patient unable to tolerate this very well, however. Encouraged follow up with wound care for long term management. Return here in two days if unable to get to wound care. Additional rocephin provided today as well.  Final Clinical Impressions(s) / UC Diagnoses   Final diagnoses:  Encounter for recheck of abscess following incision and drainage  Cellulitis of right lower extremity     Discharge Instructions     Continue with antibiotics as previously prescribed.  Please follow up with our Wound Care clinic, call today to try to get in in the next few days.  If unable to get in soon, may return here for recheck in two days.  Please go to the ER if develop any worsening of pain, redness, or fevers.    ED Prescriptions    None     PDMP not  reviewed this encounter.   Georgetta Haber, NP 03/12/19 1310

## 2019-03-12 NOTE — Discharge Instructions (Signed)
Continue with antibiotics as previously prescribed.  Please follow up with our Wound Care clinic, call today to try to get in in the next few days.  If unable to get in soon, may return here for recheck in two days.  Please go to the ER if develop any worsening of pain, redness, or fevers.

## 2019-03-21 ENCOUNTER — Other Ambulatory Visit: Payer: Self-pay

## 2019-03-21 ENCOUNTER — Emergency Department (HOSPITAL_COMMUNITY)
Admission: EM | Admit: 2019-03-21 | Discharge: 2019-03-21 | Disposition: A | Discharge status: Home / Self Care | Payer: Medicaid Other | Attending: Emergency Medicine | Admitting: Emergency Medicine

## 2019-03-21 ENCOUNTER — Encounter (HOSPITAL_COMMUNITY): Payer: Self-pay | Admitting: *Deleted

## 2019-03-21 DIAGNOSIS — Z590 Homelessness: Secondary | ICD-10-CM | POA: Insufficient documentation

## 2019-03-21 DIAGNOSIS — R404 Transient alteration of awareness: Secondary | ICD-10-CM | POA: Diagnosis present

## 2019-03-21 DIAGNOSIS — F17210 Nicotine dependence, cigarettes, uncomplicated: Secondary | ICD-10-CM | POA: Diagnosis not present

## 2019-03-21 DIAGNOSIS — Z79899 Other long term (current) drug therapy: Secondary | ICD-10-CM | POA: Insufficient documentation

## 2019-03-21 MED ORDER — ACETAMINOPHEN 325 MG PO TABS
650.0000 mg | ORAL_TABLET | Freq: Once | ORAL | Status: DC
  Filled 2019-03-21: qty 2

## 2019-03-21 NOTE — Discharge Instructions (Addendum)
Make sure that you are eating and drinking regularly.  Use Tylenol or Motrin for pain.
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
## 2019-03-21 NOTE — ED Notes (Signed)
Pt tolerating eating and drinking well

## 2019-03-21 NOTE — ED Triage Notes (Signed)
Pt sleeping and does not open her eys when name is called. Pt responds to light sternal rub. Unable access Iv site because of poor veins.

## 2019-03-21 NOTE — ED Notes (Signed)
Pt states tylenol is not strong enough for her pain. RN notified MD. MD aware.

## 2019-03-21 NOTE — ED Notes (Signed)
RN made two attempts to stick for IV and blood. RN was unsuccessful. IV team order is in.

## 2019-03-21 NOTE — ED Notes (Signed)
Discharge instructions discussed with pt. Pt verbalized understanding. Pt stable and ambulatory. No signature pad available. 
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
## 2019-03-28 ENCOUNTER — Other Ambulatory Visit: Payer: Self-pay

## 2019-03-28 ENCOUNTER — Ambulatory Visit (HOSPITAL_COMMUNITY)
Admission: EM | Admit: 2019-03-28 | Discharge: 2019-03-28 | Disposition: A | Discharge status: Home / Self Care | Payer: Medicaid Other | Attending: Family Medicine | Admitting: Family Medicine

## 2019-03-28 ENCOUNTER — Encounter (HOSPITAL_COMMUNITY): Payer: Self-pay

## 2019-03-28 DIAGNOSIS — L03115 Cellulitis of right lower limb: Secondary | ICD-10-CM

## 2019-03-28 DIAGNOSIS — F1190 Opioid use, unspecified, uncomplicated: Secondary | ICD-10-CM

## 2019-03-28 DIAGNOSIS — Z09 Encounter for follow-up examination after completed treatment for conditions other than malignant neoplasm: Secondary | ICD-10-CM

## 2019-03-28 DIAGNOSIS — F1120 Opioid dependence, uncomplicated: Secondary | ICD-10-CM

## 2019-03-28 MED ORDER — GABAPENTIN 400 MG PO CAPS: 800.0000 mg | ORAL_CAPSULE | Freq: Three times a day (TID) | ORAL | 0 refills | Status: AC

## 2019-03-28 MED ORDER — SULFAMETHOXAZOLE-TRIMETHOPRIM 800-160 MG PO TABS: 1.0000 | ORAL_TABLET | Freq: Two times a day (BID) | ORAL | 0 refills | Status: AC

## 2019-03-28 NOTE — Discharge Instructions (Addendum)
Continue with warm compresses to the affected area.  5 additional days of antibiotics.  Please return for any worsening of pain redness or swelling, or fevers.  EKG is normal today

## 2019-03-28 NOTE — ED Triage Notes (Signed)
Pt here for wound follow-up on her right lower leg, states she had a abscess drained and wants to make sure it is healing okay. Pt also wants an EKG, states her methadone clinic needs one on file so they can change her methadone dosage.

## 2019-03-28 NOTE — ED Provider Notes (Signed)
[place]    CSN: [id number] Arrival date & time: 03/28/19  1572      History   Chief Complaint Chief Complaint  Patient presents with  . Follow-up    HPI Linda Rosales is a 53 y.o. female.   Linda Rosales presents for follow up s/p I&D of right posterior calf. She completed a course of bactrim, and had received two IM doses of Rocephin. Significant improvement. Now with only mild redness. Minimal drainage. No fevers. Also requesting EKG as she is starting? Methadone and needs a current ekg first. History  Of polysubstance abuse. No chest pain , no palpitations.    ROS per HPI, negative if not otherwise mentioned.      History reviewed. No pertinent past medical history.  There are no active problems to display for this patient.   History reviewed. No pertinent surgical history.  OB History   No obstetric history on file.      Home Medications    Prior to Admission medications   Medication Sig Start Date End Date Taking? Authorizing Provider  diclofenac sodium (VOLTAREN) 1 % GEL Apply 4 g topically 4 (four) times daily. Patient not taking: Reported on 02/10/2019 10/17/18   [name], [name] C, PA-C  gabapentin (NEURONTIN) 400 MG capsule Take 2 capsules (800 mg total) by mouth 3 (three) times daily for 2 days. 03/28/19 03/30/19  Zigmund Gottron, NP  ibuprofen (ADVIL) 800 MG tablet Take 1 tablet (800 mg total) by mouth every 6 (six) hours as needed for moderate pain. 02/11/19   [name], MD  lidocaine (LIDODERM) 5 % Place 1 patch onto the skin daily. Remove & Discard patch within 12 hours or as directed by MD Patient not taking: Reported on 02/10/2019 02/05/19   Rodell Perna A, PA-C  methocarbamol (ROBAXIN) 500 MG tablet Take 1 tablet (500 mg total) by mouth 2 (two) times daily. Patient not taking: Reported on 02/10/2019 02/05/19   Rodell Perna A, PA-C  naproxen (NAPROSYN) 500 MG tablet Take 1 tablet (500 mg total) by mouth 2 (two) times daily  with a meal. Patient not taking: Reported on 02/10/2019 02/05/19   Rodell Perna A, PA-C  oxyCODONE-acetaminophen (PERCOCET/ROXICET) 5-325 MG tablet Take 1 tablet by mouth every 6 (six) hours as needed for severe pain. Patient not taking: Reported on 02/10/2019 10/17/18   [name], [name] C, PA-C  sulfamethoxazole-trimethoprim (BACTRIM DS) 800-160 MG tablet Take 1 tablet by mouth 2 (two) times daily for 5 days. 03/28/19 04/02/19  Zigmund Gottron, NP    Family History Family History  Problem Relation Age of Onset  . Cancer Mother   . Cancer Father   . Cancer Brother     Social History Social History   Tobacco Use  . Smoking status: Current Every Day Smoker    Packs/day: 0.25    Types: Cigarettes  . Smokeless tobacco: Never Used  Substance Use Topics  . Alcohol use: Not Currently  . Drug use: Not Currently    Comment: former     Allergies   Patient has no known allergies.   Review of Systems Review of Systems   Physical Exam Triage Vital Signs ED Triage Vitals  Enc Vitals Group     BP 03/28/19 1004 99/65     Pulse Rate 03/28/19 1004 80     Resp 03/28/19 1004 16     Temp 03/28/19 1004 97.9 F (36.6 C)     Temp Source 03/28/19 1004 Oral  SpO2 03/28/19 1004 97 %     Weight --      Height --      Head Circumference --      Peak Flow --      Pain Score 03/28/19 1002 0     Pain Loc --      Pain Edu? --      Excl. in GC? --    No data found.  Updated Vital Signs BP 99/65 (BP Location: Left Arm)   Pulse 80   Temp 97.9 F (36.6 C) (Oral)   Resp 16   SpO2 97%   Visual Acuity Right Eye Distance:   Left Eye Distance:   Bilateral Distance:    Right Eye Near:   Left Eye Near:    Bilateral Near:     Physical Exam Constitutional:      General: She is not in acute distress.    Appearance: She is well-developed.  Cardiovascular:     Rate and Rhythm: Normal rate.  Pulmonary:     Effort: Pulmonary effort is normal.  Skin:    General: Skin is warm and dry.      Comments: Right posterior calf with significant improvement, incision well healing and now with some redness with mild tenderness just proximal to incision  Neurological:     Mental Status: She is alert and oriented to person, place, and time.    EKG:  NSR. Previous EKG was available for review. No stwave changes as interpreted by me.    UC Treatments / Results  Labs (all labs ordered are listed, but only abnormal results are displayed) Labs Reviewed - No data to display  EKG   Radiology No results found.  Procedures Procedures (including critical care time)  Medications Ordered in UC Medications - No data to display  Initial Impression / Assessment and Plan / UC Course  I have reviewed the triage vital signs and the nursing notes.  Pertinent labs & imaging results that were available during my care of the patient were reviewed by me and considered in my medical decision making (see chart for details).     Significant improvement to calf wound. Will provide an additional 5 days of antibiotics as still with mild redness and tenderness to soft tissue, although no further fluctuance and significant overall improvement. ekg obtained and normal here today. Refilled gabapentin for two days as she is awaiting [facility] ref[facility]. Return precautions provided. Patient verbalized understanding and agreeable to plan.    Final Clinical Impressions(s) / UC Diagnoses   Final diagnoses:  Encounter for recheck of abscess following incision and drainage  Cellulitis of right lower extremity  Methadone use (HCC)     Discharge Instructions     Continue with warm compresses to the affected area.  5 additional days of antibiotics.  Please return for any worsening of pain redness or swelling, or fevers.  EKG is normal today    ED Prescriptions    Medication Sig Dispense Auth. Provider   sulfamethoxazole-trimethoprim (BACTRIM DS) 800-160 MG tablet Take 1 tablet by mouth 2 (two) times daily  for 5 days. 10 tablet Linus Mako B, NP   gabapentin (NEURONTIN) 400 MG capsule Take 2 capsules (800 mg total) by mouth 3 (three) times daily for 2 days. 12 capsule Georgetta Haber, NP     PDMP not reviewed this encounter.   Georgetta Haber, NP 03/28/19 1037

## 2019-04-28 ENCOUNTER — Emergency Department (HOSPITAL_COMMUNITY): Payer: Medicaid Other

## 2019-04-28 ENCOUNTER — Emergency Department (HOSPITAL_COMMUNITY)
Admission: EM | Admit: 2019-04-28 | Discharge: 2019-04-28 | Disposition: A | Discharge status: Home / Self Care | Payer: Medicaid Other | Source: Home / Self Care | Attending: Emergency Medicine | Admitting: Emergency Medicine

## 2019-04-28 ENCOUNTER — Other Ambulatory Visit: Payer: Self-pay

## 2019-04-28 ENCOUNTER — Encounter (HOSPITAL_COMMUNITY): Payer: Self-pay

## 2019-04-28 ENCOUNTER — Emergency Department (HOSPITAL_COMMUNITY)
Admission: EM | Admit: 2019-04-28 | Discharge: 2019-04-29 | Disposition: A | Discharge status: Home / Self Care | Payer: Medicaid Other | Attending: Emergency Medicine | Admitting: Emergency Medicine

## 2019-04-28 DIAGNOSIS — R4182 Altered mental status, unspecified: Secondary | ICD-10-CM | POA: Insufficient documentation

## 2019-04-28 DIAGNOSIS — Z79899 Other long term (current) drug therapy: Secondary | ICD-10-CM | POA: Insufficient documentation

## 2019-04-28 DIAGNOSIS — Z23 Encounter for immunization: Secondary | ICD-10-CM | POA: Insufficient documentation

## 2019-04-28 DIAGNOSIS — S0081XA Abrasion of other part of head, initial encounter: Secondary | ICD-10-CM | POA: Insufficient documentation

## 2019-04-28 DIAGNOSIS — Y999 Unspecified external cause status: Secondary | ICD-10-CM | POA: Insufficient documentation

## 2019-04-28 DIAGNOSIS — Y939 Activity, unspecified: Secondary | ICD-10-CM | POA: Insufficient documentation

## 2019-04-28 DIAGNOSIS — F17210 Nicotine dependence, cigarettes, uncomplicated: Secondary | ICD-10-CM | POA: Insufficient documentation

## 2019-04-28 DIAGNOSIS — S70311A Abrasion, right thigh, initial encounter: Secondary | ICD-10-CM | POA: Insufficient documentation

## 2019-04-28 DIAGNOSIS — T07XXXA Unspecified multiple injuries, initial encounter: Secondary | ICD-10-CM

## 2019-04-28 DIAGNOSIS — S0231XA Fracture of orbital floor, right side, initial encounter for closed fracture: Secondary | ICD-10-CM | POA: Insufficient documentation

## 2019-04-28 DIAGNOSIS — F1110 Opioid abuse, uncomplicated: Secondary | ICD-10-CM | POA: Insufficient documentation

## 2019-04-28 DIAGNOSIS — Z20828 Contact with and (suspected) exposure to other viral communicable diseases: Secondary | ICD-10-CM | POA: Diagnosis not present

## 2019-04-28 DIAGNOSIS — Y92410 Unspecified street and highway as the place of occurrence of the external cause: Secondary | ICD-10-CM | POA: Insufficient documentation

## 2019-04-28 DIAGNOSIS — T50901A Poisoning by unspecified drugs, medicaments and biological substances, accidental (unintentional), initial encounter: Secondary | ICD-10-CM | POA: Diagnosis not present

## 2019-04-28 DIAGNOSIS — F410 Panic disorder [episodic paroxysmal anxiety] without agoraphobia: Secondary | ICD-10-CM | POA: Diagnosis present

## 2019-04-28 LAB — I-STAT CHEM 8, ED
BUN: 21 mg/dL — ABNORMAL HIGH (ref 6–20)
Calcium, Ion: 1.18 mmol/L (ref 1.15–1.40)
Chloride: 101 mmol/L (ref 98–111)
Creatinine, Ser: 0.9 mg/dL (ref 0.44–1.00)
Glucose, Bld: 81 mg/dL (ref 70–99)
HCT: 39 % (ref 36.0–46.0)
Hemoglobin: 13.3 g/dL (ref 12.0–15.0)
Potassium: 4.1 mmol/L (ref 3.5–5.1)
Sodium: 137 mmol/L (ref 135–145)
TCO2: 30 mmol/L (ref 22–32)

## 2019-04-28 LAB — URINALYSIS, COMPLETE (UACMP) WITH MICROSCOPIC
Bacteria, UA: NONE SEEN
Bilirubin Urine: NEGATIVE
Glucose, UA: NEGATIVE mg/dL
Hgb urine dipstick: NEGATIVE
Ketones, ur: NEGATIVE mg/dL
Leukocytes,Ua: NEGATIVE
Nitrite: NEGATIVE
Protein, ur: NEGATIVE mg/dL
Specific Gravity, Urine: 1.033 — ABNORMAL HIGH (ref 1.005–1.030)
pH: 7 (ref 5.0–8.0)

## 2019-04-28 LAB — CBC WITH DIFFERENTIAL/PLATELET
Abs Immature Granulocytes: 0.01 10*3/uL (ref 0.00–0.07)
Basophils Absolute: 0 10*3/uL (ref 0.0–0.1)
Basophils Relative: 1 %
Eosinophils Absolute: 0.2 10*3/uL (ref 0.0–0.5)
Eosinophils Relative: 5 %
HCT: 37.7 % (ref 36.0–46.0)
Hemoglobin: 12.5 g/dL (ref 12.0–15.0)
Immature Granulocytes: 0 %
Lymphocytes Relative: 28 %
Lymphs Abs: 1.2 10*3/uL (ref 0.7–4.0)
MCH: 30.1 pg (ref 26.0–34.0)
MCHC: 33.2 g/dL (ref 30.0–36.0)
MCV: 90.8 fL (ref 80.0–100.0)
Monocytes Absolute: 0.7 10*3/uL (ref 0.1–1.0)
Monocytes Relative: 16 %
Neutro Abs: 2.3 10*3/uL (ref 1.7–7.7)
Neutrophils Relative %: 50 %
Platelets: 176 10*3/uL (ref 150–400)
RBC: 4.15 MIL/uL (ref 3.87–5.11)
RDW: 14.3 % (ref 11.5–15.5)
WBC: 4.5 10*3/uL (ref 4.0–10.5)
nRBC: 0 % (ref 0.0–0.2)

## 2019-04-28 LAB — SAMPLE TO BLOOD BANK

## 2019-04-28 LAB — COMPREHENSIVE METABOLIC PANEL
ALT: 112 U/L — ABNORMAL HIGH (ref 0–44)
ALT: 105 U/L — ABNORMAL HIGH (ref 0–44)
AST: 112 U/L — ABNORMAL HIGH (ref 15–41)
AST: 103 U/L — ABNORMAL HIGH (ref 15–41)
Albumin: 3.9 g/dL (ref 3.5–5.0)
Albumin: 3.6 g/dL (ref 3.5–5.0)
Alkaline Phosphatase: 123 U/L (ref 38–126)
Alkaline Phosphatase: 112 U/L (ref 38–126)
Anion gap: 10 (ref 5–15)
Anion gap: 12 (ref 5–15)
BUN: 17 mg/dL (ref 6–20)
BUN: 11 mg/dL (ref 6–20)
CO2: 26 mmol/L (ref 22–32)
CO2: 25 mmol/L (ref 22–32)
Calcium: 9.5 mg/dL (ref 8.9–10.3)
Calcium: 9 mg/dL (ref 8.9–10.3)
Chloride: 101 mmol/L (ref 98–111)
Chloride: 103 mmol/L (ref 98–111)
Creatinine, Ser: 0.88 mg/dL (ref 0.44–1.00)
Creatinine, Ser: 0.77 mg/dL (ref 0.44–1.00)
GFR calc Af Amer: >60 mL/min (ref >60)
GFR calc Af Amer: >60 mL/min (ref >60)
GFR calc non Af Amer: >60 mL/min (ref >60)
GFR calc non Af Amer: >60 mL/min (ref >60)
Glucose, Bld: 82 mg/dL (ref 70–99)
Glucose, Bld: 83 mg/dL (ref 70–99)
Potassium: 4.3 mmol/L (ref 3.5–5.1)
Potassium: 3.4 mmol/L — ABNORMAL LOW (ref 3.5–5.1)
Sodium: 137 mmol/L (ref 135–145)
Sodium: 140 mmol/L (ref 135–145)
Total Bilirubin: 0.3 mg/dL (ref 0.3–1.2)
Total Bilirubin: 0.7 mg/dL (ref 0.3–1.2)
Total Protein: 7 g/dL (ref 6.5–8.1)
Total Protein: 6.8 g/dL (ref 6.5–8.1)

## 2019-04-28 LAB — RAPID URINE DRUG SCREEN, HOSP PERFORMED
Amphetamines: POSITIVE — AB
Barbiturates: NOT DETECTED
Benzodiazepines: POSITIVE — AB
Cocaine: NOT DETECTED
Opiates: POSITIVE — AB
Tetrahydrocannabinol: NOT DETECTED

## 2019-04-28 LAB — PROTIME-INR
INR: 0.9 (ref 0.8–1.2)
Prothrombin Time: 12 seconds (ref 11.4–15.2)

## 2019-04-28 LAB — URINALYSIS, ROUTINE W REFLEX MICROSCOPIC
Bilirubin Urine: NEGATIVE
Glucose, UA: NEGATIVE mg/dL
Hgb urine dipstick: NEGATIVE
Ketones, ur: NEGATIVE mg/dL
Leukocytes,Ua: NEGATIVE
Nitrite: NEGATIVE
Protein, ur: NEGATIVE mg/dL
Specific Gravity, Urine: 1.006 (ref 1.005–1.030)
pH: 7 (ref 5.0–8.0)

## 2019-04-28 LAB — CBC
HCT: 38.9 % (ref 36.0–46.0)
Hemoglobin: 12.5 g/dL (ref 12.0–15.0)
MCH: 29.4 pg (ref 26.0–34.0)
MCHC: 32.1 g/dL (ref 30.0–36.0)
MCV: 91.5 fL (ref 80.0–100.0)
Platelets: 163 10*3/uL (ref 150–400)
RBC: 4.25 MIL/uL (ref 3.87–5.11)
RDW: 14.1 % (ref 11.5–15.5)
WBC: 3.4 10*3/uL — ABNORMAL LOW (ref 4.0–10.5)
nRBC: 0 % (ref 0.0–0.2)

## 2019-04-28 LAB — I-STAT BETA HCG BLOOD, ED (MC, WL, AP ONLY)
I-stat hCG, quantitative: <5 m[IU]/mL (ref <5)
I-stat hCG, quantitative: <5 m[IU]/mL (ref <5)

## 2019-04-28 LAB — ETHANOL
Alcohol, Ethyl (B): 12 mg/dL — ABNORMAL HIGH (ref <10)
Alcohol, Ethyl (B): <10 mg/dL (ref <10)

## 2019-04-28 LAB — CBG MONITORING, ED: Glucose-Capillary: 92 mg/dL (ref 70–99)

## 2019-04-28 LAB — LACTIC ACID, PLASMA: Lactic Acid, Venous: 1.6 mmol/L (ref 0.5–1.9)

## 2019-04-28 LAB — CDS SEROLOGY

## 2019-04-28 MED ORDER — LORAZEPAM 2 MG/ML IJ SOLN: 2.0000 mg | Freq: Once | INTRAMUSCULAR | Status: DC

## 2019-04-28 MED ORDER — IOHEXOL 300 MG/ML  SOLN
100.0000 mL | Freq: Once | INTRAMUSCULAR | Status: AC | PRN
  Administered 2019-04-28: 100 mL via INTRAVENOUS

## 2019-04-28 MED ORDER — SODIUM CHLORIDE 0.9 % IV BOLUS
1000.0000 mL | Freq: Once | INTRAVENOUS | Status: AC
  Administered 2019-04-28: 1000 mL via INTRAVENOUS

## 2019-04-28 MED ORDER — LORAZEPAM 2 MG/ML IJ SOLN: 2.0000 mg | INTRAMUSCULAR | Status: DC

## 2019-04-28 MED ORDER — BACITRACIN ZINC 500 UNIT/GM EX OINT
TOPICAL_OINTMENT | Freq: Two times a day (BID) | CUTANEOUS | Status: AC
  Administered 2019-04-28: 14:00:00 via TOPICAL

## 2019-04-28 MED ORDER — LORAZEPAM 2 MG/ML IJ SOLN
2.0000 mg | Freq: Once | INTRAMUSCULAR | Status: AC
  Administered 2019-04-28: 2 mg via INTRAVENOUS
  Filled 2019-04-28: qty 1

## 2019-04-28 MED ORDER — HALOPERIDOL LACTATE 5 MG/ML IJ SOLN
INTRAMUSCULAR | Status: AC
  Administered 2019-04-28: 5 mg
  Filled 2019-04-28: qty 1

## 2019-04-28 MED ORDER — TETANUS-DIPHTH-ACELL PERTUSSIS 5-2.5-18.5 LF-MCG/0.5 IM SUSP
0.5000 mL | Freq: Once | INTRAMUSCULAR | Status: AC
  Administered 2019-04-28: 10:00:00 0.5 mL via INTRAMUSCULAR

## 2019-04-28 MED ORDER — LORAZEPAM 2 MG/ML IJ SOLN: 1.0000 mg | Freq: Once | INTRAMUSCULAR | Status: DC

## 2019-04-28 MED ORDER — HALOPERIDOL LACTATE 5 MG/ML IJ SOLN: 5.0000 mg | Freq: Once | INTRAMUSCULAR | Status: DC

## 2019-04-28 MED ORDER — AFRIN NASAL SPRAY 0.05 % NA SOLN: 1.0000 | Freq: Two times a day (BID) | NASAL | 0 refills | Status: AC

## 2019-04-28 NOTE — Progress Notes (Signed)
Orthopedic Tech Progress Note Patient Details:  Linda Rosales [birth year] [id number]  Ortho Devices Ortho Device/Splint Location: Level 2 Trauma Ortho Device/Splint Interventions: Application   Post Interventions Patient Tolerated: Well Instructions Provided: Care of device   Linda Rosales 04/28/2019, 9:50 AM
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
## 2019-04-28 NOTE — ED Notes (Addendum)
Patient combative and does not follow commands. Current O2 saturation at 99% on RA.
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
## 2019-04-28 NOTE — ED Notes (Signed)
Ambulated patient with no assistance, PT walked around room with no complaints. PT placed back in bed and hooked up to monitor
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
## 2019-04-28 NOTE — ED Triage Notes (Signed)
Pt back to the ED after being seen earlier today , pt is back asking for meds for "panic attacks"

## 2019-04-28 NOTE — ED Notes (Signed)
Patient transported to CT 
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
## 2019-04-29 ENCOUNTER — Other Ambulatory Visit: Payer: Self-pay

## 2019-04-29 LAB — SARS CORONAVIRUS 2 (TAT 6-24 HRS): SARS Coronavirus 2: NEGATIVE

## 2019-04-29 MED ORDER — SODIUM CHLORIDE 0.9 % IV BOLUS
1000.0000 mL | Freq: Once | INTRAVENOUS | Status: AC
  Administered 2019-04-29: 1000 mL via INTRAVENOUS

## 2019-04-29 NOTE — ED Notes (Signed)
Pt able to ambulate on the hallway and to the bathroom by her self. PO challenge started, pt able to drink fluids with no sign of nausea or vomiting.
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
## 2019-04-30 ENCOUNTER — Encounter (HOSPITAL_COMMUNITY): Payer: Self-pay

## 2020-01-08 ENCOUNTER — Ambulatory Visit (HOSPITAL_COMMUNITY)
Admission: EM | Admit: 2020-01-08 | Discharge: 2020-01-08 | Disposition: A | Discharge status: Home / Self Care | Payer: Medicaid Other | Attending: Behavioral Health | Admitting: Behavioral Health

## 2020-01-08 ENCOUNTER — Other Ambulatory Visit: Payer: Self-pay

## 2020-01-08 ENCOUNTER — Encounter (HOSPITAL_COMMUNITY): Payer: Self-pay | Admitting: Emergency Medicine

## 2020-01-08 DIAGNOSIS — F17210 Nicotine dependence, cigarettes, uncomplicated: Secondary | ICD-10-CM | POA: Insufficient documentation

## 2020-01-08 DIAGNOSIS — F22 Delusional disorders: Secondary | ICD-10-CM | POA: Insufficient documentation

## 2020-01-08 DIAGNOSIS — Z20822 Contact with and (suspected) exposure to covid-19: Secondary | ICD-10-CM | POA: Insufficient documentation

## 2020-01-08 DIAGNOSIS — R45851 Suicidal ideations: Secondary | ICD-10-CM | POA: Insufficient documentation

## 2020-01-08 DIAGNOSIS — F1994 Other psychoactive substance use, unspecified with psychoactive substance-induced mood disorder: Secondary | ICD-10-CM | POA: Insufficient documentation

## 2020-01-08 LAB — POC SARS CORONAVIRUS 2 AG: SARS Coronavirus 2 Ag: NEGATIVE

## 2020-01-08 LAB — POCT URINE DRUG SCREEN - MANUAL ENTRY (I-SCREEN)
POC Amphetamine UR: NOT DETECTED
POC Buprenorphine (BUP): POSITIVE — AB
POC Cocaine UR: POSITIVE — AB
POC Marijuana UR: NOT DETECTED
POC Methadone UR: NOT DETECTED
POC Methamphetamine UR: POSITIVE — AB
POC Morphine: POSITIVE — AB
POC Oxazepam (BZO): NOT DETECTED
POC Oxycodone UR: NOT DETECTED
POC Secobarbital (BAR): NOT DETECTED

## 2020-01-08 LAB — SARS CORONAVIRUS 2 BY RT PCR (HOSPITAL ORDER, PERFORMED IN ~~LOC~~ HOSPITAL LAB): SARS Coronavirus 2: NEGATIVE

## 2020-01-08 LAB — POCT PREGNANCY, URINE: Preg Test, Ur: NEGATIVE

## 2020-01-08 LAB — POC SARS CORONAVIRUS 2 AG -  ED: SARS Coronavirus 2 Ag: NEGATIVE

## 2020-01-08 MED ORDER — MAGNESIUM HYDROXIDE 400 MG/5ML PO SUSP: 30.0000 mL | Freq: Every day | ORAL | Status: DC | PRN

## 2020-01-08 MED ORDER — ACETAMINOPHEN 325 MG PO TABS: 650.0000 mg | ORAL_TABLET | Freq: Four times a day (QID) | ORAL | Status: DC | PRN

## 2020-01-08 MED ORDER — TRAZODONE HCL 50 MG PO TABS: 50.0000 mg | ORAL_TABLET | Freq: Every evening | ORAL | Status: DC | PRN

## 2020-01-08 MED ORDER — HYDROXYZINE HCL 25 MG PO TABS: 25.0000 mg | ORAL_TABLET | Freq: Three times a day (TID) | ORAL | Status: DC | PRN

## 2020-01-08 MED ORDER — ALUM & MAG HYDROXIDE-SIMETH 200-200-20 MG/5ML PO SUSP: 30.0000 mL | ORAL | Status: DC | PRN

## 2020-01-08 NOTE — ED Triage Notes (Addendum)
Pt presents with paranoia, feeling someone is following her.  Denies SI, HI.  Pt admits to drug use 3-4 days ago.

## 2020-01-08 NOTE — ED Provider Notes (Signed)
FBC/OBS ASAP Discharge Summary  Date and Time: 01/08/2020 7:54 AM  Name: Linda Rosales  MRN:  [id number]   Discharge Diagnoses:  Final diagnoses:  None    Subjective: Patient reports today that she is feeling better.  She states that she needs to leave so she can go get her methadone at her methadone clinic at [facility].  She states that she has been on methadone for several years.  Patient denies any suicidal homicidal ideations and denies any hallucinations.  Patient was offered various resources and patient stated that she would like to follow-up outpatient appointment if possible.  Patient was informed that she needed to contact the mark and resource was provided to her.  Stay Summary: Patient is a 54 year old female who presented to the [facility] UC via law enforcement related to voices and was following her.  Patient has a history of polysubstance abuse and was tested positive for buprenorphine, cocaine, morphine, and methamphetamines.  Patient reported that she receives methadone through [facility] patient reports that she last use illicit substances 4 to 5 days ago.  Patient denies any suicidal homicidal ideations today and states that she needs to leave so she could get her methadone today.  Upon chart review patient was at [facility] on August 7 and that when he first met his [facility] on progress fifth and for things very similar presentations.  Patient was discharged from his MDs the next morning.  Based on chart review patient is homeless.  Patient was provided resources for day mark.  Patient was discharged home per patient's request.  Patient did not meet inpatient psychiatric treatment does not meet any criteria for involuntary commitment.  Total Time spent with patient: 30 minutes  Past Psychiatric History: Polysubstance abuse, anxiety, frequent visits to various EDs reporting very similar symptoms Past Medical History: History reviewed. No pertinent past medical  history. History reviewed. No pertinent surgical history. Family History:  Family History  Problem Relation Age of Onset  . Cancer Mother   . Cancer Father   . Cancer Brother    Family Psychiatric History: None reported Social History:  Social History   Substance and Sexual Activity  Alcohol Use Yes   Comment: occ     Social History   Substance and Sexual Activity  Drug Use Yes  . Types: IV   Comment: former    Social History   Socioeconomic History  . Marital status: Single    Spouse name: Not on file  . Number of children: Not on file  . Years of education: Not on file  . Highest education level: Not on file  Occupational History  . Not on file  Tobacco Use  . Smoking status: Current Every Day Smoker    Packs/day: 0.25    Types: Cigarettes  . Smokeless tobacco: Never Used  Vaping Use  . Vaping Use: Never used  Substance and Sexual Activity  . Alcohol use: Yes    Comment: occ  . Drug use: Yes    Types: IV    Comment: former  . Sexual activity: Not on file  Other Topics Concern  . Not on file  Social History Narrative   ** Merged History Encounter **       Social Determinants of Health   Financial Resource Strain:   . Difficulty of Paying Living Expenses:   Food Insecurity:   . Worried About Charity fundraiser in the Last Year:   . [place] in the  Last Year:   Transportation Needs:   . Film/video editor (Medical):   Marland Kitchen Lack of Transportation (Non-Medical):   Physical Activity:   . Days of Exercise per Week:   . Minutes of Exercise per Session:   Stress:   . Feeling of Stress :   Social Connections:   . Frequency of Communication with Friends and Family:   . Frequency of Social Gatherings with Friends and Family:   . Attends Religious Services:   . Active Member of Clubs or Organizations:   . Attends Archivist Meetings:   Marland Kitchen Marital Status:    SDOH:  SDOH Screenings   Alcohol Screen:   . Last Alcohol Screening Score  (AUDIT):   Depression (PHQ2-9):   . PHQ-2 Score:   Financial Resource Strain:   . Difficulty of Paying Living Expenses:   Food Insecurity:   . Worried About Charity fundraiser in the Last Year:   . [place] in the Last Year:   Housing:   . Last Housing Risk Score:   Physical Activity:   . Days of Exercise per Week:   . Minutes of Exercise per Session:   Social Connections:   . Frequency of Communication with Friends and Family:   . Frequency of Social Gatherings with Friends and Family:   . Attends Religious Services:   . Active Member of Clubs or Organizations:   . Attends Archivist Meetings:   Marland Kitchen Marital Status:   Stress:   . Feeling of Stress :   Tobacco Use: High Risk  . Smoking Tobacco Use: Current Every Day Smoker  . Smokeless Tobacco Use: Never Used  Transportation Needs:   . Film/video editor (Medical):   Marland Kitchen Lack of Transportation (Non-Medical):     Has this patient used any form of tobacco in the last 30 days? (Cigarettes, Smokeless Tobacco, Cigars, and/or Pipes) A prescription for an FDA-approved tobacco cessation medication was offered at discharge and the patient refused  Current Medications:  Current Facility-Administered Medications  Medication Dose Route Frequency Provider Last Rate Last Admin  . acetaminophen (TYLENOL) tablet 650 mg  650 mg Oral Q6H PRN Caroline Sauger, NP      . alum & mag hydroxide-simeth (MAALOX/MYLANTA) 200-200-20 MG/5ML suspension 30 mL  30 mL Oral Q4H PRN Caroline Sauger, NP      . hydrOXYzine (ATARAX/VISTARIL) tablet 25 mg  25 mg Oral TID PRN Caroline Sauger, NP      . magnesium hydroxide (MILK OF MAGNESIA) suspension 30 mL  30 mL Oral Daily PRN Caroline Sauger, NP      . traZODone (DESYREL) tablet 50 mg  50 mg Oral QHS PRN Caroline Sauger, NP       Current Outpatient Medications  Medication Sig Dispense Refill  . aspirin-acetaminophen-caffeine (EXCEDRIN MIGRAINE) 250-250-65 MG tablet  Take 1-2 tablets by mouth every 6 (six) hours as needed for headache or migraine.    . diclofenac sodium (VOLTAREN) 1 % GEL Apply 4 g topically 4 (four) times daily. (Patient not taking: Reported on 02/10/2019) 100 g 0  . gabapentin (NEURONTIN) 400 MG capsule Take 2 capsules (800 mg total) by mouth 3 (three) times daily for 2 days. 12 capsule 0  . gabapentin (NEURONTIN) 800 MG tablet Take 800 mg by mouth 3 (three) times daily.    Marland Kitchen ibuprofen (ADVIL) 200 MG tablet Take 200-400 mg by mouth every 6 (six) hours as needed for headache or mild pain.    Marland Kitchen  ibuprofen (ADVIL) 800 MG tablet Take 1 tablet (800 mg total) by mouth every 6 (six) hours as needed for moderate pain. 20 tablet 0  . lidocaine (LIDODERM) 5 % Place 1 patch onto the skin daily. Remove & Discard patch within 12 hours or as directed by MD (Patient not taking: Reported on 02/10/2019) 30 patch 0  . methadone (DOLOPHINE) 10 MG/5ML solution Take 105 mg by mouth daily.    . methocarbamol (ROBAXIN) 500 MG tablet Take 1 tablet (500 mg total) by mouth 2 (two) times daily. (Patient not taking: Reported on 02/10/2019) 20 tablet 0  . mirtazapine (REMERON) 30 MG tablet Take 30 mg by mouth at bedtime.    . naproxen (NAPROSYN) 500 MG tablet Take 1 tablet (500 mg total) by mouth 2 (two) times daily with a meal. (Patient not taking: Reported on 02/10/2019) 30 tablet 0  . NON FORMULARY Place 1-2 tablets under the tongue See admin instructions. Pink Get Up & Go B12 plus L-Carnitine sublingual tablets: Dissolve 1-2 tablets sublingually once a day    . oxyCODONE-acetaminophen (PERCOCET/ROXICET) 5-325 MG tablet Take 1 tablet by mouth every 6 (six) hours as needed for severe pain. (Patient not taking: Reported on 02/10/2019) 6 tablet 0    PTA Medications: (Not in a hospital admission)   Musculoskeletal  Strength & Muscle Tone: within normal limits Gait & Station: normal Patient leans: N/A  Psychiatric Specialty Exam  Presentation  General Appearance:  Disheveled;Appropriate for Environment  Eye Contact:Good  Speech:Clear and Coherent;Normal Rate  Speech Volume:Decreased  Handedness:Right   Mood and Affect  Mood:Anxious  Affect:Congruent   Thought Process  Thought Processes:Coherent  Descriptions of Associations:Intact  Orientation:Full (Time, Place and Person)  Thought Content:WDL  Hallucinations:Hallucinations: None  Ideas of Reference:None  Suicidal Thoughts:Suicidal Thoughts: No SI Passive Intent and/or Plan: Without Intent  Homicidal Thoughts:Homicidal Thoughts: No   Sensorium  Memory:Immediate Good;Recent Good;Remote Good  Judgment:Fair  Insight:Fair   Executive Functions  Concentration:Good  Attention Span:Good  [place]  Language:Good   Psychomotor Activity  Psychomotor Activity:Psychomotor Activity: Increased   Assets  Assets:Communication Skills;Desire for Improvement;Financial Resources/Insurance   Sleep  Sleep:Sleep: Fair   Physical Exam  Physical Exam Vitals and nursing note reviewed.  Constitutional:      Appearance: She is well-developed.  Cardiovascular:     Rate and Rhythm: Normal rate.  Pulmonary:     Effort: Pulmonary effort is normal.  Musculoskeletal:        General: Normal range of motion.  Skin:    General: Skin is warm.  Neurological:     Mental Status: She is alert and oriented to person, place, and time.    Review of Systems  Constitutional: Negative.   HENT: Negative.   Eyes: Negative.   Respiratory: Negative.   Cardiovascular: Negative.   Gastrointestinal: Negative.   Genitourinary: Negative.   Musculoskeletal: Negative.   Skin: Negative.   Neurological: Negative.   Endo/Heme/Allergies: Negative.   Psychiatric/Behavioral: Positive for substance abuse. The patient is nervous/anxious.    Blood pressure 133/75, pulse 88, temperature 98.5 F (36.9 C), temperature source Oral, resp. rate 18, SpO2 99 %. There is no  height or weight on file to calculate BMI.  Demographic Factors:  Caucasian and Low socioeconomic status  Loss Factors: NA  Historical Factors: Impulsivity  Risk Reduction Factors:   Positive social support  Continued Clinical Symptoms:  Alcohol/Substance Abuse/Dependencies  Cognitive Features That Contribute To Risk:  None    Suicide Risk:  Mild:  Suicidal ideation of limited frequency, intensity, duration, and specificity.  There are no identifiable plans, no associated intent, mild dysphoria and related symptoms, good self-control (both objective and subjective assessment), few other risk factors, and identifiable protective factors, including available and accessible social support.  Plan Of Care/Follow-up recommendations:  Continue activity as tolerated. Continue diet as recommended by your PCP. Ensure to keep all appointments with outpatient providers.  Disposition: Discharge home. Requested resources for follow up outpatient appointment  Lewis Shock, FNP 01/08/2020, 7:54 AM

## 2020-01-08 NOTE — Discharge Instructions (Addendum)
Take medications as prescribed Call and make an appointment with [facility]

## 2020-01-08 NOTE — ED Notes (Signed)
Received Linda Rosales this AM at the nurses station, she is anxious to leave related to going to the methadone clinic. She received her AVS, her questions were answered.She received her personal belonging and was escorted to the lobby with her bus tokens.

## 2020-01-08 NOTE — ED Notes (Addendum)
Pt A&O x 4, presents with paranoia, feeling someone is following her.  Denies SI or HI.  Pt admits to drug use 3-4 days ago.  Skin search completed, monitoring for safety.  Pt is calm & cooperative, no distress noted.

## 2020-01-08 NOTE — ED Notes (Signed)
RN attempted, blood draw x 2, unsuccessful.

## 2020-01-08 NOTE — ED Provider Notes (Signed)
Behavioral Health Admission H&P  [facility] & OBS)  Date: 01/08/20 Patient Name: Linda Rosales MRN: [id number] Chief Complaint:  Chief Complaint  Patient presents with  . Paranoid   Chief Complaint/Presenting Problem: Paranoia and SI with plan to hang self.  Diagnoses: Polysubstance Abuse, Acute  Final diagnoses:  None   HPI: Linda Rosales presents to [facility] by law enforcement voluntarily due to her voicing that someone is following her. The patient stated that this person was following her three days ago and was also asked to leave the place where she was residing. The patient voiced, "I used to use drugs in the past." She was then asked when she last used substances? "I used four to five days ago." The patient has a history of poly-substance abuse. On today's visit, the patient is positive for Buprenorphine, Cocaine, Methamphetamine, and Morphine. The patient voiced she has suicidal Ideation because she is scared of the person who is following her. She expressed she is anxious and experiencing some depressive symptoms. The patient states she has seen psychiatric providers at [facility] in the past. She says she was proscribed medications from there and last took them three weeks ago. The patient voiced that she does not have any support system.   PHQ 2-9:    Documentation from 10/11/2018 in [facility]IENT [facility] Documentation from 10/04/2018 in [facility]IENT [facility] Documentation from 09/27/2018 in PATIENT [facility]  Thoughts that you would be better off dead, or of hurting yourself in some way Not at all Not at all Not at all  PHQ-9 Total Score 0 0 4        Total Time spent with patient: 30 minutes  Musculoskeletal  Strength & Muscle Tone: within normal limits Gait & Station: normal Patient leans: N/A  Psychiatric Specialty Exam  Presentation General Appearance: Appropriate for Environment;Disheveled  Eye Contact:Good  Speech:Clear  and Coherent;Normal Rate  Speech Volume:Normal  Handedness:Right   Mood and Affect  Mood:Anxious  Affect:Flat   Thought Process  Thought Processes:Coherent  Descriptions of Associations:Intact  Orientation:Full (Time, Place and Person)  Thought Content:WDL  Hallucinations:No data recorded Ideas of Reference:Paranoia  Suicidal Thoughts:Suicidal Thoughts: Yes, Passive SI Passive Intent and/or Plan: Without Intent  Homicidal Thoughts:Homicidal Thoughts: No   Sensorium  Memory:Immediate Good;Recent Good;Remote Good  Judgment:Fair  Insight:Lacking   Executive Functions  Concentration:Good  Attention Span:Good  Recall:Good  Fund of Knowledge:Good  Language:Good   Psychomotor Activity  Psychomotor Activity:Psychomotor Activity: Normal   Assets  Assets:Social Support;Financial Resources/Insurance;Housing;Desire for Improvement   Sleep  Sleep:Sleep: Poor   Physical Exam ROS  Blood pressure 133/75, pulse 88, temperature 98.5 F (36.9 C), temperature source Oral, resp. rate 18, SpO2 99 %. There is no height or weight on file to calculate BMI.  Past Psychiatric History: Polysubstance abuse, Anxiety  Is the patient at risk to self? No  Has the patient been a risk to self in the past 6 months? No .    Has the patient been a risk to self within the distant past? No   Is the patient a risk to others? No   Has the patient been a risk to others in the past 6 months? No   Has the patient been a risk to others within the distant past? No   Past Medical History: History reviewed. No  pertinent past medical history. History reviewed. No pertinent surgical history.  Family History:  Family History  Problem Relation Age of Onset  . Cancer Mother   . Cancer Father   . Cancer Brother     Social History:  Social History   Socioeconomic History  . Marital status: Single    Spouse name: Not on file  . Number of children: Not on file  . Years of  education: Not on file  . Highest education level: Not on file  Occupational History  . Not on file  Tobacco Use  . Smoking status: Current Every Day Smoker    Packs/day: 0.25    Types: Cigarettes  . Smokeless tobacco: Never Used  Vaping Use  . Vaping Use: Never used  Substance and Sexual Activity  . Alcohol use: Yes    Comment: occ  . Drug use: Yes    Types: IV    Comment: former  . Sexual activity: Not on file  Other Topics Concern  . Not on file  Social History Narrative   ** Merged History Encounter **       Social Determinants of Health   Financial Resource Strain:   . Difficulty of Paying Living Expenses:   Food Insecurity:   . Worried About Programme researcher, broadcasting/film/video in the Last Year:   . Barista in the Last Year:   Transportation Needs:   . Freight forwarder (Medical):   Marland Kitchen Lack of Transportation (Non-Medical):   Physical Activity:   . Days of Exercise per Week:   . Minutes of Exercise per Session:   Stress:   . Feeling of Stress :   Social Connections:   . Frequency of Communication with Friends and Family:   . Frequency of Social Gatherings with Friends and Family:   . Attends Religious Services:   . Active Member of Clubs or Organizations:   . Attends Banker Meetings:   Marland Kitchen Marital Status:   Intimate Partner Violence:   . Fear of Current or Ex-Partner:   . Emotionally Abused:   Marland Kitchen Physically Abused:   . Sexually Abused:     SDOH:  SDOH Screenings   Alcohol Screen:   . Last Alcohol Screening Score (AUDIT):   Depression (PHQ2-9):   . PHQ-2 Score:   Financial Resource Strain:   . Difficulty of Paying Living Expenses:   Food Insecurity:   . Worried About Programme researcher, broadcasting/film/video in the Last Year:   . [employer] of Food in the Last Year:   Housing:   . Last Housing Risk Score:   Physical Activity:   . Days of Exercise per Week:   . Minutes of Exercise per Session:   Social Connections:   . Frequency of Communication with Friends and  Family:   . Frequency of Social Gatherings with Friends and Family:   . Attends Religious Services:   . Active Member of Clubs or Organizations:   . Attends Banker Meetings:   Marland Kitchen Marital Status:   Stress:   . Feeling of Stress :   Tobacco Use: High Risk  . Smoking Tobacco Use: Current Every Day Smoker  . Smokeless Tobacco Use: Never Used  Transportation Needs:   . Freight forwarder (Medical):   Marland Kitchen Lack of Transportation (Non-Medical):     Last Labs:  Admission on 01/08/2020  Component Date Value Ref Range Status  . POC Amphetamine UR 01/08/2020 None Detected  None Detected Preliminary  .  POC Secobarbital (BAR) 01/08/2020 None Detected  None Detected Preliminary  . POC Buprenorphine (BUP) 01/08/2020 Positive* None Detected Preliminary  . POC Oxazepam (BZO) 01/08/2020 None Detected  None Detected Preliminary  . POC Cocaine UR 01/08/2020 Positive* None Detected Preliminary  . POC Methamphetamine UR 01/08/2020 Positive* None Detected Preliminary  . POC Morphine 01/08/2020 Positive* None Detected Preliminary  . POC Oxycodone UR 01/08/2020 None Detected  None Detected Preliminary  . POC Methadone UR 01/08/2020 None Detected  None Detected Preliminary  . POC Marijuana UR 01/08/2020 None Detected  None Detected Preliminary  . SARS Coronavirus 2 Ag 01/08/2020 Negative  Negative Preliminary  . SARS Coronavirus 2 Ag 01/08/2020 NEGATIVE  NEGATIVE Final   Comment: (NOTE) SARS-CoV-2 antigen NOT DETECTED.   Negative results are presumptive.  Negative results do not preclude SARS-CoV-2 infection and should not be used as the sole basis for treatment or other patient management decisions, including infection  control decisions, particularly in the presence of clinical signs and  symptoms consistent with COVID-19, or in those who have been in contact with the virus.  Negative results must be combined with clinical observations, patient history, and epidemiological information.  The expected result is Negative.  Fact Sheet for Patients: https://sanders-williams.net/  Fact Sheet for Healthcare Providers: https://martinez.com/   This test is not yet approved or cleared by the [place] FDA and  has been authorized for detection and/or diagnosis of SARS-CoV-2 by FDA under an Emergency Use Authorization (EUA).  This EUA will remain in effect (meaning this test can be used) for the duration of  the C                          OVID-19 declaration under Section 564(b)(1) of the Act, 21 U.S.C. section 360bbb-3(b)(1), unless the authorization is terminated or revoked sooner.    . Preg Test, Ur 01/08/2020 NEGATIVE  NEGATIVE Final   Comment:        THE SENSITIVITY OF THIS METHODOLOGY IS >24 mIU/mL     Allergies: Patient has no known allergies.  PTA Medications: (Not in a hospital admission)   Medical Decision Making  The patient will remain under observation overnight and reassess in the A.M. to determine if she meets the criteria for psychiatric inpatient admission or can be discharged. Clinical Course as of Jan 08 408  Tue Jan 08, 2020  0348 POC Buprenorphine (BUP)(!): Positive [JT]  0349 POC Methamphetamine UR(!): Positive [JT]  0350 POC Morphine(!): Positive [JT]    Clinical Course User Index [JT] Gillermo Murdoch, NP    Recommendations  Based on my evaluation the patient does not appear to have an emergency medical condition.  Gillermo Murdoch, NP 01/08/20  4:09 AM
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
## 2021-08-16 IMAGING — CT CT HEAD W/O CM
3 series · 16 of 47 positions shown, 19 images · non-contrast
Comparison: None.

CLINICAL DATA: Altered mental status, heroin use

EXAM:
CT HEAD WITHOUT CONTRAST
TECHNIQUE: Contiguous axial images were obtained from the base of the skull
through the vertex without intravenous contrast.

[Series 2: head wo · axial · 0.47mm/px · z∈[-74,+51]mm · 10 of 31 slices shown, 13 images]
[im 3/31  brain]
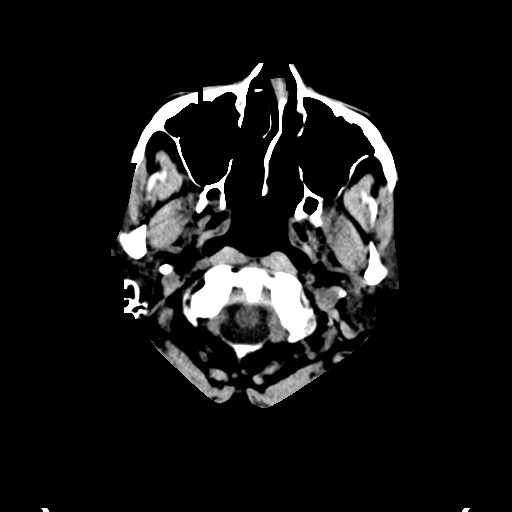
[im 3/31  bone]
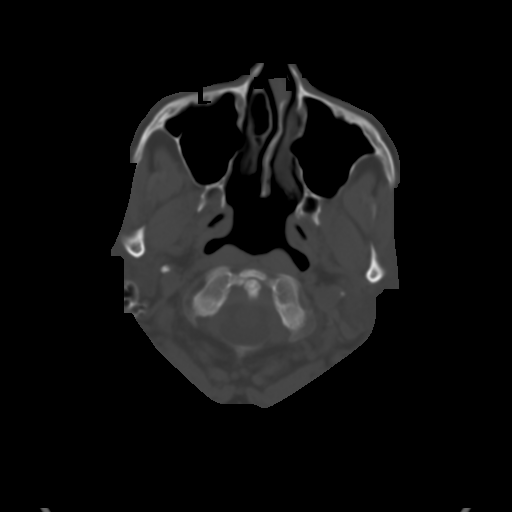
[im 6/31  brain]
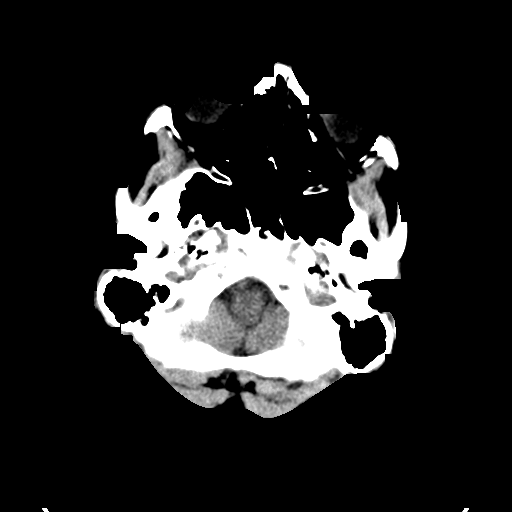
[im 9/31  brain]
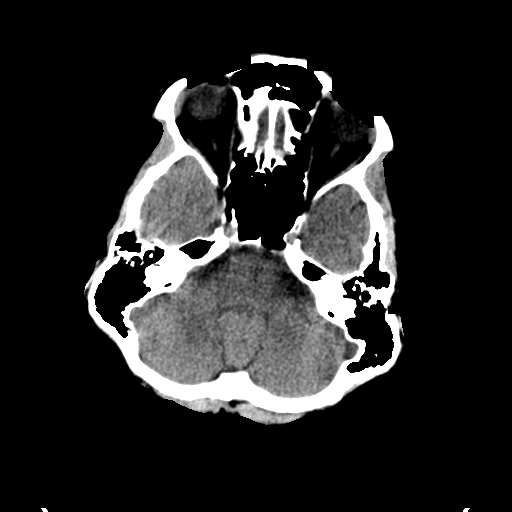
[im 11/31  brain]
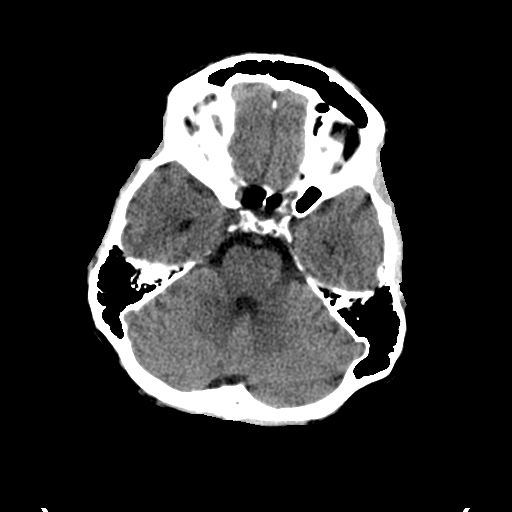
[im 14/31  brain]
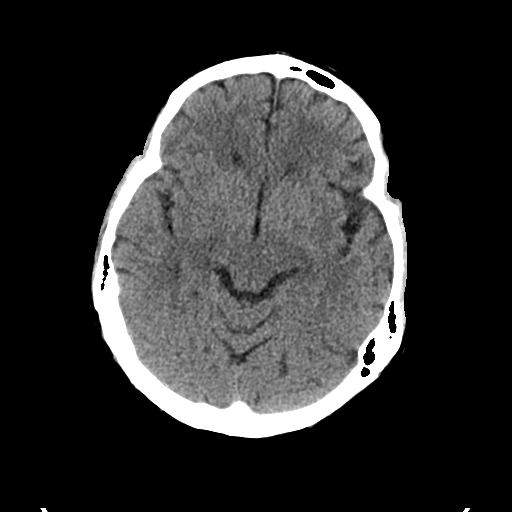
[im 14/31  bone]
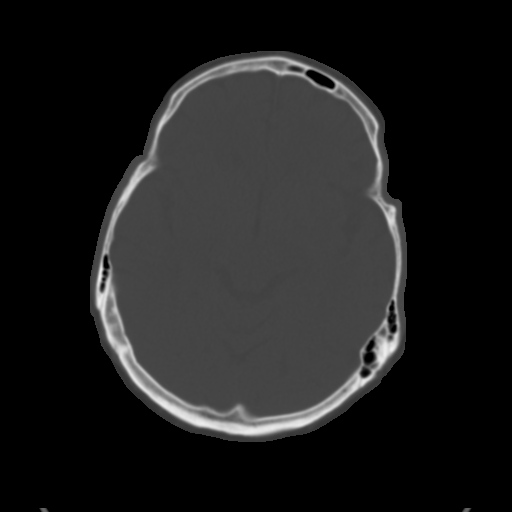
[im 17/31  brain]
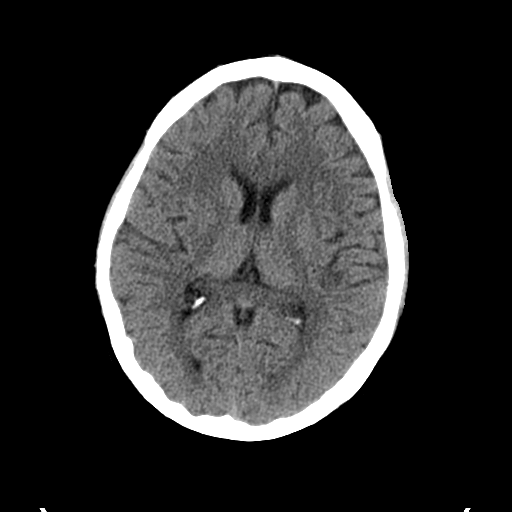
[im 20/31  brain]
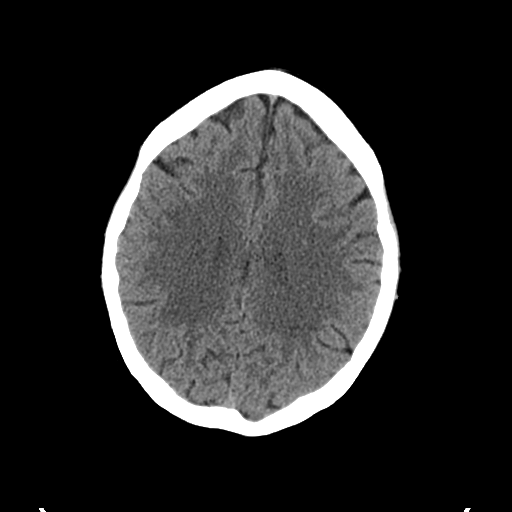
[im 23/31  brain]
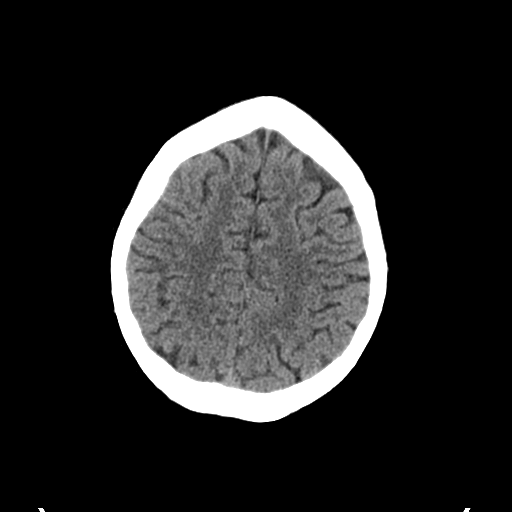
[im 25/31  brain]
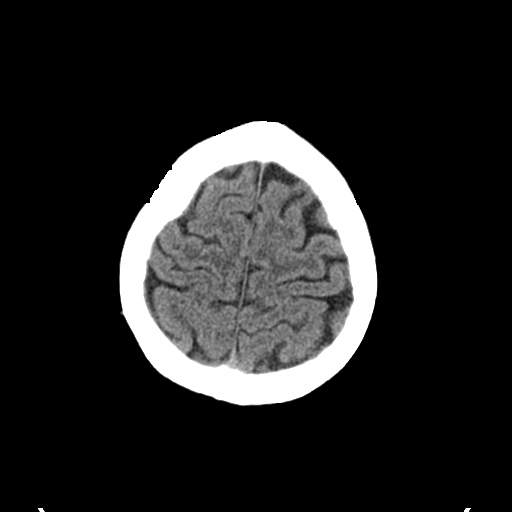
[im 25/31  bone]
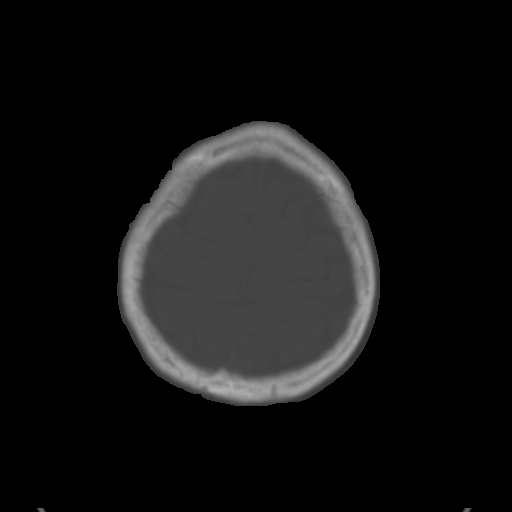
[im 28/31  brain]
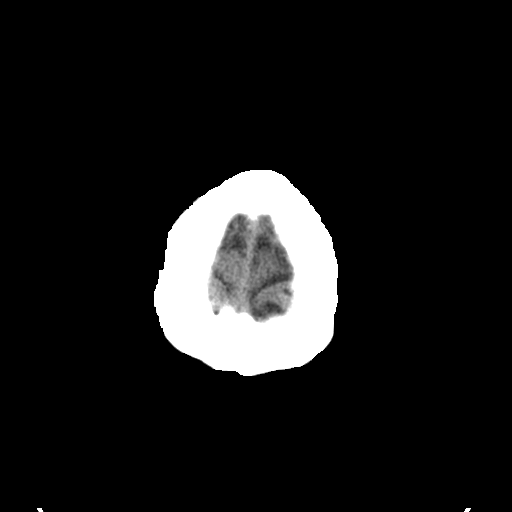

[Series 4: coronal soft tissue · coronal · 0.30mm/px · 3 of 62 slices shown]
[im 21/62  brain]
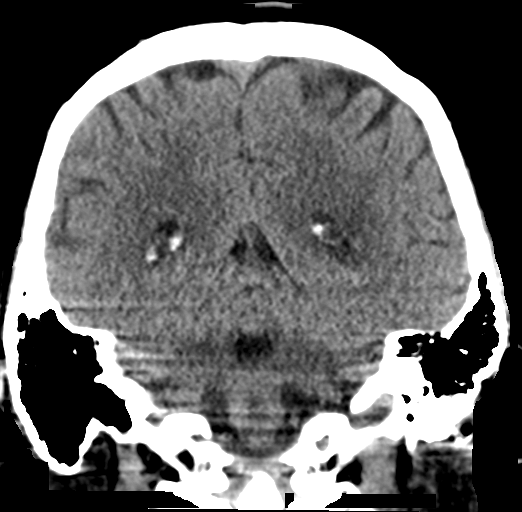
[im 28/62  brain]
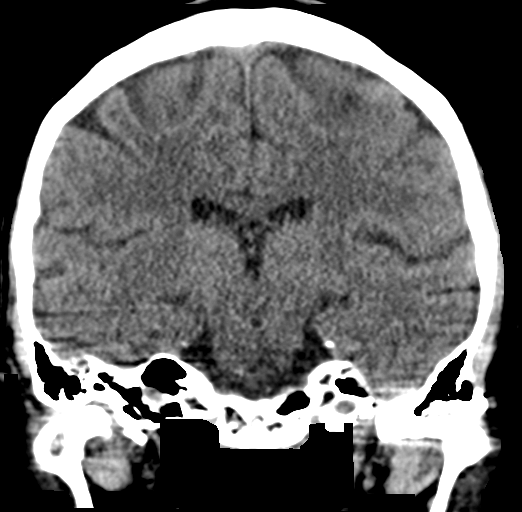
[im 34/62  brain]
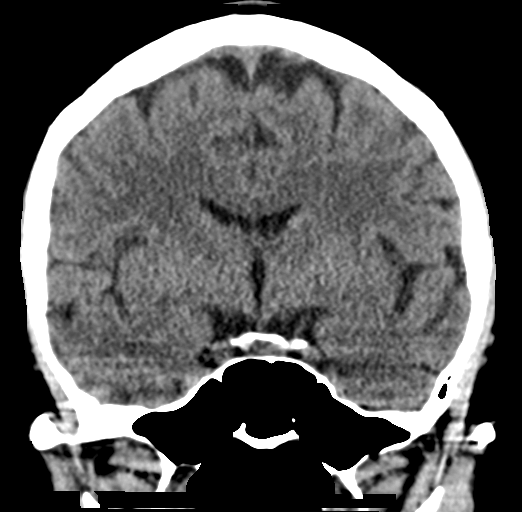

[Series 5: sagittal soft tissue · sagittal · 0.30mm/px · 3 of 51 slices shown]
[im 17/51  brain]
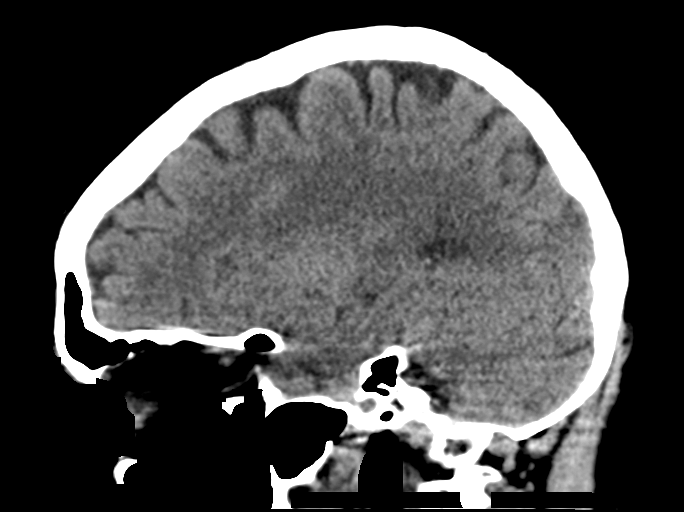
[im 26/51  brain]
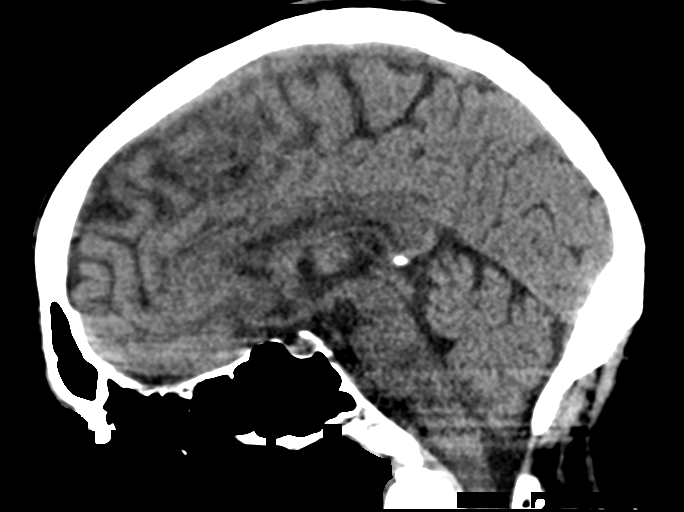
[im 34/51  brain]
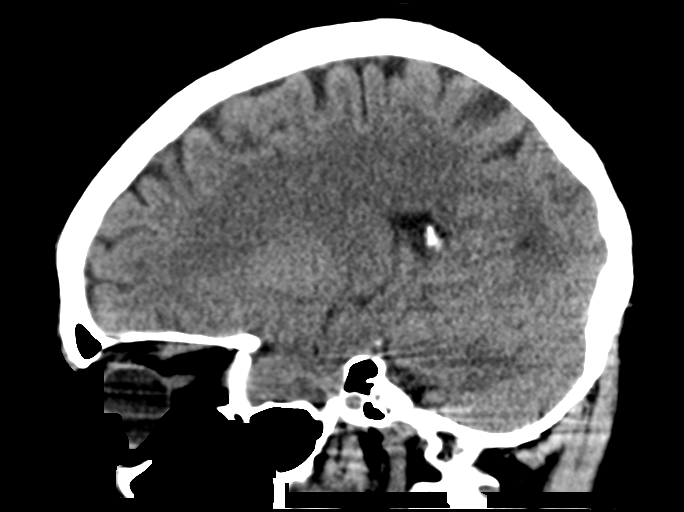

[16 of 47 positions shown; findings below may reference images not displayed]

FINDINGS: Brain: No evidence of acute infarction, hemorrhage, hydrocephalus,
extra-axial collection or mass lesion/mass effect.

Vascular: No hyperdense vessel or unexpected calcification.

Skull: No calvarial fracture or suspicious osseous lesion. No scalp
swelling or hematoma.

Sinuses/Orbits: Paranasal sinuses and mastoid air cells are
predominantly clear. Extensive pneumatization of the petrous apices
in temporal bones. Included orbital structures are unremarkable.

Other: None.
IMPRESSION: Normal noncontrast head CT.
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
